# Patient Record
Sex: Male | Born: 2000 | Race: Black or African American | Hispanic: No | Marital: Single | Smoking: Current some day smoker
Health system: Southern US, Community
[De-identification: ages and names within clinical notes are randomized; demographics above are authoritative.]

---
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
## 2021-02-27 ENCOUNTER — Ambulatory Visit: Payer: Self-pay

## 2021-02-27 ENCOUNTER — Other Ambulatory Visit (HOSPITAL_COMMUNITY): Payer: Self-pay

## 2021-02-27 ENCOUNTER — Encounter: Payer: Self-pay | Admitting: Infectious Disease

## 2021-02-27 ENCOUNTER — Other Ambulatory Visit: Payer: Self-pay

## 2021-02-27 ENCOUNTER — Telehealth: Payer: Self-pay

## 2021-02-27 ENCOUNTER — Ambulatory Visit (INDEPENDENT_AMBULATORY_CARE_PROVIDER_SITE_OTHER): Payer: Self-pay | Admitting: Infectious Disease

## 2021-02-27 VITALS — BP 127/82 | HR 77 | Temp 98.0°F | Ht 70.0 in | Wt 158.0 lb

## 2021-02-27 DIAGNOSIS — Z23 Encounter for immunization: Secondary | ICD-10-CM

## 2021-02-27 DIAGNOSIS — B20 Human immunodeficiency virus [HIV] disease: Secondary | ICD-10-CM

## 2021-02-27 MED ORDER — BIKTARVY 50-200-25 MG PO TABS: 1.0000 | ORAL_TABLET | Freq: Every day | ORAL | 11 refills | Status: DC

## 2021-02-27 NOTE — Progress Notes (Signed)
Chief complaint establish care for HIV disease  Subjective:    Patient ID: Ethan Mitchell, male    DOB: 08-02-2001, 20 y.o.   MRN: [id number]  HPI  Ethan Mitchell is a 20 year old black man recently diagnosed with HIV.  He had been tested at the [facility] regularly since 2019.  He had been counseled regarding PrEP but has never initiated it.  He tested positive for HIV on 17 March.  He is referred to clinic for same-day initiation of antiretroviral therapy.  He does not recall any febrile episodes or symptoms or symptoms that were flulike or rash over the last couple of months to suggest symptomatic acute HIV infection.  Clearly based on his testing record though his infection is relatively recent.  We went over all of the different first-line regimens for HIV treatment and he wished to start [facility] and we gave him his first dose today in clinic.  No past medical history on file.     Social History   Socioeconomic History  . Marital status: Single    Spouse name: Not on file  . Number of children: Not on file  . Years of education: Not on file  . Highest education level: Not on file  Occupational History  . Not on file  Tobacco Use  . Smoking status: Current Some Day Smoker    Types: Cigarettes  . Smokeless tobacco: Never Used  . Tobacco comment: social smoker, sometimes vapes   Substance and Sexual Activity  . Alcohol use: Yes    Comment: socially   . Drug use: Yes    Types: Marijuana    Comment: daily   . Sexual activity: Not Currently    Partners: Male  Other Topics Concern  . Not on file  Social History Narrative  . Not on file   Social Determinants of Health   Financial Resource Strain: Not on file  Food Insecurity: Not on file  Transportation Needs: Not on file  Physical Activity: Not on file  Stress: Not on file  Social Connections: Not on file    No Known Allergies   Current Outpatient Medications:  .   bictegravir-emtricitabine-tenofovir AF (BIKTARVY) 50-200-25 MG TABS tablet, Take 1 tablet by mouth daily., Disp: 30 tablet, Rfl: 11    Review of Systems  Constitutional: Negative for activity change, appetite change, chills, diaphoresis, fatigue, fever and unexpected weight change.  HENT: Negative for congestion, rhinorrhea, sinus pressure, sneezing, sore throat and trouble swallowing.   Eyes: Negative for photophobia and visual disturbance.  Respiratory: Negative for cough, chest tightness, shortness of breath, wheezing and stridor.   Cardiovascular: Negative for chest pain, palpitations and leg swelling.  Gastrointestinal: Negative for abdominal distention, abdominal pain, anal bleeding, blood in stool, constipation, diarrhea, nausea and vomiting.  Genitourinary: Negative for difficulty urinating, dysuria, flank pain and hematuria.  Musculoskeletal: Negative for arthralgias, back pain, gait problem, joint swelling and myalgias.  Skin: Negative for color change, pallor, rash and wound.  Neurological: Negative for dizziness, tremors, weakness and light-headedness.  Hematological: Negative for adenopathy. Does not bruise/bleed easily.  Psychiatric/Behavioral: Negative for agitation, behavioral problems, confusion, decreased concentration, dysphoric mood and sleep disturbance.       Objective:   Physical Exam Constitutional:      Appearance: He is well-developed.  HENT:     Head: Normocephalic and atraumatic.  Eyes:     Conjunctiva/sclera: Conjunctivae normal.  Cardiovascular:     Rate and Rhythm: Normal rate and regular rhythm.  Pulmonary:  Effort: Pulmonary effort is normal. No respiratory distress.     Breath sounds: No wheezing.  Abdominal:     General: There is no distension.     Palpations: Abdomen is soft.  Musculoskeletal:        General: No tenderness. Normal range of motion.     Cervical back: Normal range of motion and neck supple.  Skin:    General: Skin is warm  and dry.     Coloration: Skin is not pale.     Findings: No erythema or rash.  Neurological:     General: No focal deficit present.     Mental Status: He is alert and oriented to person, place, and time.  Psychiatric:        Mood and Affect: Mood normal.        Behavior: Behavior normal.        Thought Content: Thought content normal.        Judgment: Judgment normal.           Assessment & Plan:  HIV disease: Relatively recently diagnosed with lymphatic past couple of months likely seroconverted test positive from the March 17 expect his viral load is probably quite high still.  We have started BIKTARVY and he took his first dose today.  He met with Timmothy Sours to enroll into HMAP and RW.  Baseline labs today and he will get repeat labs 1 month from now and see me in 6 weeks.  We will to pull his vaccine records and he seems up-to-date on most vaccines though he does need vaccination for pneumonia and we gave him Prevnar No. 13  I spent greater than 60 minutes with the patient including greater than 50% of time in face to face counsel of the patient the nature of HIV infection how we control it with typically 1 pill once a day antiretroviral therapy how we can protect his immune system from being attacked from HIV with antiretrovirals and preserve his immune function how we can render his virus undetectable in the blood and also then on transmissible to other sexual partners and in coordination of his care

## 2021-02-27 NOTE — Telephone Encounter (Addendum)
[facility] Patient Product/process development scientist completed.    The patient is uninsured and will need patient assistance for medication.  We gave patient 4 bottles (28 day supply) of Biktarvy Samples  We can complete the application and will need to meet with the patient for signatures and income documentation.  Clearance Coots, CPhT Specialty Pharmacy Patient [facility] for Infectious Disease Phone: [phone] Fax:  [phone]

## 2021-02-28 LAB — URINE CYTOLOGY ANCILLARY ONLY
Chlamydia: NEGATIVE
Comment: NEGATIVE
Comment: NORMAL
Neisseria Gonorrhea: NEGATIVE

## 2021-02-28 LAB — T-HELPER CELL (CD4) - (RCID CLINIC ONLY)
CD4 % Helper T Cell: 40 % (ref 33–65)
CD4 T Cell Abs: 769 /uL (ref 400–1790)

## 2021-03-07 ENCOUNTER — Telehealth: Payer: Self-pay

## 2021-03-07 NOTE — Telephone Encounter (Addendum)
[facility] Patient Advocate Encounter  Completed and sent [employer] Advancing Access application for Biktarvy for this patient who is uninsured.    Patient is approved 03/07/21 through 03/07/22.        Clearance Coots, CPhT Specialty Pharmacy Patient [facility] for Infectious Disease Phone: [phone] Fax:  [phone]

## 2021-03-09 LAB — COMPLETE METABOLIC PANEL WITH GFR
AG Ratio: 1.5 (calc) (ref 1.0–2.5)
ALT: 13 U/L (ref 9–46)
AST: 24 U/L (ref 10–40)
Albumin: 4.8 g/dL (ref 3.6–5.1)
Alkaline phosphatase (APISO): 54 U/L (ref 36–130)
BUN: 13 mg/dL (ref 7–25)
CO2: 26 mmol/L (ref 20–32)
Calcium: 9.5 mg/dL (ref 8.6–10.3)
Chloride: 101 mmol/L (ref 98–110)
Creat: 1.01 mg/dL (ref 0.60–1.35)
GFR, Est African American: 123 mL/min/{1.73_m2} (ref >=60)
GFR, Est Non African American: 106 mL/min/{1.73_m2} (ref >=60)
Globulin: 3.1 g/dL (calc) (ref 1.9–3.7)
Glucose, Bld: 85 mg/dL (ref 65–99)
Potassium: 3.9 mmol/L (ref 3.5–5.3)
Sodium: 135 mmol/L (ref 135–146)
Total Bilirubin: 0.4 mg/dL (ref 0.2–1.2)
Total Protein: 7.9 g/dL (ref 6.1–8.1)

## 2021-03-09 LAB — HIV-1 GENOTYPE: HIV-1 Genotype: DETECTED — AB

## 2021-03-09 LAB — FLUORESCENT TREPONEMAL AB(FTA)-IGG-BLD: Fluorescent Treponemal ABS: REACTIVE — AB

## 2021-03-09 LAB — CBC WITH DIFFERENTIAL/PLATELET
Absolute Monocytes: 485 cells/uL (ref 200–950)
Basophils Absolute: 30 cells/uL (ref 0–200)
Basophils Relative: 0.6 %
Eosinophils Absolute: 180 cells/uL (ref 15–500)
Eosinophils Relative: 3.6 %
HCT: 38.4 % — ABNORMAL LOW (ref 38.5–50.0)
Hemoglobin: 12.2 g/dL — ABNORMAL LOW (ref 13.2–17.1)
Lymphs Abs: 2230 cells/uL (ref 850–3900)
MCH: 25.3 pg — ABNORMAL LOW (ref 27.0–33.0)
MCHC: 31.8 g/dL — ABNORMAL LOW (ref 32.0–36.0)
MCV: 79.7 fL — ABNORMAL LOW (ref 80.0–100.0)
MPV: 9.1 fL (ref 7.5–12.5)
Monocytes Relative: 9.7 %
Neutro Abs: 2075 cells/uL (ref 1500–7800)
Neutrophils Relative %: 41.5 %
Platelets: 317 10*3/uL (ref 140–400)
RBC: 4.82 10*6/uL (ref 4.20–5.80)
RDW: 14.1 % (ref 11.0–15.0)
Total Lymphocyte: 44.6 %
WBC: 5 10*3/uL (ref 3.8–10.8)

## 2021-03-09 LAB — QUANTIFERON-TB GOLD PLUS
Mitogen-NIL: >10 IU/mL
NIL: 0.15 IU/mL
QuantiFERON-TB Gold Plus: NEGATIVE
TB1-NIL: 0 IU/mL
TB2-NIL: <0 IU/mL

## 2021-03-09 LAB — HIV-1 INTEGRASE GENOTYPE

## 2021-03-09 LAB — RPR TITER: RPR Titer: 1:4 {titer} — ABNORMAL HIGH

## 2021-03-09 LAB — HEPATITIS PANEL, ACUTE
Hep A IgM: NONREACTIVE
Hep B C IgM: NONREACTIVE
Hepatitis B Surface Ag: NONREACTIVE
Hepatitis C Ab: NONREACTIVE
SIGNAL TO CUT-OFF: 0.27 (ref <1.00)

## 2021-03-09 LAB — HLA B*5701: HLA-B*5701 w/rflx HLA-B High: NEGATIVE

## 2021-03-09 LAB — RPR: RPR Ser Ql: REACTIVE — AB

## 2021-03-09 LAB — HIV RNA, RTPCR W/R GT (RTI, PI,INT)
HIV 1 RNA Quant: 22600 copies/mL — ABNORMAL HIGH
HIV-1 RNA Quant, Log: 4.35 Log copies/mL — ABNORMAL HIGH

## 2021-04-11 ENCOUNTER — Ambulatory Visit (INDEPENDENT_AMBULATORY_CARE_PROVIDER_SITE_OTHER): Payer: Self-pay | Admitting: Infectious Disease

## 2021-04-11 ENCOUNTER — Encounter: Payer: Self-pay | Admitting: Infectious Disease

## 2021-04-11 ENCOUNTER — Other Ambulatory Visit: Payer: Self-pay

## 2021-04-11 VITALS — BP 136/81 | HR 82 | Temp 98.4°F | Wt 161.4 lb

## 2021-04-11 DIAGNOSIS — R197 Diarrhea, unspecified: Secondary | ICD-10-CM

## 2021-04-11 DIAGNOSIS — F32A Depression, unspecified: Secondary | ICD-10-CM

## 2021-04-11 DIAGNOSIS — F329 Major depressive disorder, single episode, unspecified: Secondary | ICD-10-CM

## 2021-04-11 DIAGNOSIS — B20 Human immunodeficiency virus [HIV] disease: Secondary | ICD-10-CM | POA: Insufficient documentation

## 2021-04-11 HISTORY — DX: Human immunodeficiency virus (HIV) disease: B20

## 2021-04-11 NOTE — Progress Notes (Signed)
Chief complaint: He had some loose stools after starting BIKTARVY also had some trouble for short bit of time with some depressive symptoms as he came to terms with his HIV diagnosis.  Subjective:    Patient ID: Ethan Mitchell, male    DOB: Jun 24, 2001, 20 y.o.   MRN: [id number]  HPI  Ethan Mitchell is a 20 year old black man recently diagnosed with HIV.  He had been tested at the [facility] department regularly since 2019.  He had been counseled regarding PrEP but has never initiated it.  He tested positive for HIV on 17 March, 2022.  He was referred to our clinic for same-day initiation of antiretroviral therapy.  He did not recall any febrile episodes or symptoms or symptoms that were flulike or rash over the last couple of months to suggest symptomatic acute HIV infection.  Clearly based on his testing record though his infection is relatively recent.  We went over all of the different first-line regimens for HIV treatment and he wished to start [facility] and we gave him his first dose clinic as last visit.  He says he has been highly here and to his medications.  We wanted to check repeat labs today and expect his viral load should be undetectable by now.    Past Medical History:  Diagnosis Date  . HIV disease (HCC) 04/11/2021       Social History   Socioeconomic History  . Marital status: Single    Spouse name: Not on file  . Number of children: Not on file  . Years of education: Not on file  . Highest education level: Not on file  Occupational History  . Not on file  Tobacco Use  . Smoking status: Current Some Day Smoker    Types: E-cigarettes  . Smokeless tobacco: Never Used  . Tobacco comment: social smoker, sometimes vapes   Substance and Sexual Activity  . Alcohol use: Yes    Comment: socially   . Drug use: Yes    Types: Marijuana    Comment: daily   . Sexual activity: Not Currently    Partners: Male  Other Topics Concern  . Not on file  Social  History Narrative  . Not on file   Social Determinants of Health   Financial Resource Strain: Not on file  Food Insecurity: Not on file  Transportation Needs: Not on file  Physical Activity: Not on file  Stress: Not on file  Social Connections: Not on file    No Known Allergies   Current Outpatient Medications:  .  bictegravir-emtricitabine-tenofovir AF (BIKTARVY) 50-200-25 MG TABS tablet, Take 1 tablet by mouth daily., Disp: 30 tablet, Rfl: 11    Review of Systems  Constitutional: Negative for activity change, appetite change, chills, diaphoresis, fatigue, fever and unexpected weight change.  HENT: Negative for congestion, rhinorrhea, sinus pressure, sneezing, sore throat and trouble swallowing.   Eyes: Negative for photophobia and visual disturbance.  Respiratory: Negative for cough, chest tightness, shortness of breath, wheezing and stridor.   Cardiovascular: Negative for chest pain, palpitations and leg swelling.  Gastrointestinal: Positive for diarrhea. Negative for abdominal distention, abdominal pain, anal bleeding, blood in stool, constipation, nausea and vomiting.  Genitourinary: Negative for difficulty urinating, dysuria, flank pain and hematuria.  Musculoskeletal: Negative for arthralgias, back pain, gait problem, joint swelling and myalgias.  Skin: Negative for color change, pallor, rash and wound.  Neurological: Negative for dizziness, tremors, weakness and light-headedness.  Hematological: Negative for adenopathy. Does not bruise/bleed easily.  Psychiatric/Behavioral: Positive for dysphoric mood. Negative for agitation, behavioral problems, confusion, decreased concentration and sleep disturbance.       Objective:   Physical Exam Constitutional:      Appearance: He is well-developed.  HENT:     Head: Normocephalic and atraumatic.  Eyes:     Extraocular Movements: Extraocular movements intact.     Conjunctiva/sclera: Conjunctivae normal.  Cardiovascular:      Rate and Rhythm: Normal rate and regular rhythm.  Pulmonary:     Effort: Pulmonary effort is normal. No respiratory distress.     Breath sounds: No wheezing.  Abdominal:     General: There is no distension.     Palpations: Abdomen is soft.  Musculoskeletal:        General: No tenderness. Normal range of motion.     Cervical back: Normal range of motion and neck supple.  Skin:    General: Skin is warm and dry.     Coloration: Skin is not pale.     Findings: No erythema or rash.  Neurological:     General: No focal deficit present.     Mental Status: He is alert and oriented to person, place, and time.  Psychiatric:        Mood and Affect: Mood normal.        Behavior: Behavior normal.        Thought Content: Thought content normal.        Judgment: Judgment normal.           Assessment & Plan:  HIV disease: Continue Biktarvy checking labs today we will need to renew [facility] PHP program in July.  Loose stools: Could be related to initiating BIKTARVY seems to be resolving.  Some depressive symptoms around his diagnosis and stigma involved but he says that he is come to terms with him is moving on.  Vaccines: does he need pneumovax 23, I would think so still.  I spent greater than 40 minutes with the patient including greater than 50% of time in face to face counsel of the patient re the nature of HIV, his labs which we reviewed and in coordination of his care.

## 2021-04-12 LAB — T-HELPER CELL (CD4) - (RCID CLINIC ONLY)
CD4 % Helper T Cell: 39 % (ref 33–65)
CD4 T Cell Abs: 776 /uL (ref 400–1790)

## 2021-04-13 LAB — COMPLETE METABOLIC PANEL WITH GFR
AG Ratio: 1.5 (calc) (ref 1.0–2.5)
ALT: 10 U/L (ref 9–46)
AST: 19 U/L (ref 10–40)
Albumin: 4.4 g/dL (ref 3.6–5.1)
Alkaline phosphatase (APISO): 49 U/L (ref 36–130)
BUN: 10 mg/dL (ref 7–25)
CO2: 27 mmol/L (ref 20–32)
Calcium: 8.9 mg/dL (ref 8.6–10.3)
Chloride: 105 mmol/L (ref 98–110)
Creat: 1.02 mg/dL (ref 0.60–1.35)
GFR, Est African American: 121 mL/min/{1.73_m2} (ref >=60)
GFR, Est Non African American: 105 mL/min/{1.73_m2} (ref >=60)
Globulin: 2.9 g/dL (calc) (ref 1.9–3.7)
Glucose, Bld: 91 mg/dL (ref 65–99)
Potassium: 4.1 mmol/L (ref 3.5–5.3)
Sodium: 137 mmol/L (ref 135–146)
Total Bilirubin: 0.3 mg/dL (ref 0.2–1.2)
Total Protein: 7.3 g/dL (ref 6.1–8.1)

## 2021-04-13 LAB — CBC WITH DIFFERENTIAL/PLATELET
Absolute Monocytes: 510 cells/uL (ref 200–950)
Basophils Absolute: 31 cells/uL (ref 0–200)
Basophils Relative: 0.6 %
Eosinophils Absolute: 218 cells/uL (ref 15–500)
Eosinophils Relative: 4.2 %
HCT: 38.4 % — ABNORMAL LOW (ref 38.5–50.0)
Hemoglobin: 12.2 g/dL — ABNORMAL LOW (ref 13.2–17.1)
Lymphs Abs: 2257 cells/uL (ref 850–3900)
MCH: 25.1 pg — ABNORMAL LOW (ref 27.0–33.0)
MCHC: 31.8 g/dL — ABNORMAL LOW (ref 32.0–36.0)
MCV: 79 fL — ABNORMAL LOW (ref 80.0–100.0)
MPV: 9.1 fL (ref 7.5–12.5)
Monocytes Relative: 9.8 %
Neutro Abs: 2184 cells/uL (ref 1500–7800)
Neutrophils Relative %: 42 %
Platelets: 311 10*3/uL (ref 140–400)
RBC: 4.86 10*6/uL (ref 4.20–5.80)
RDW: 14 % (ref 11.0–15.0)
Total Lymphocyte: 43.4 %
WBC: 5.2 10*3/uL (ref 3.8–10.8)

## 2021-04-13 LAB — HIV-1 RNA QUANT-NO REFLEX-BLD
HIV 1 RNA Quant: <20 Copies/mL — ABNORMAL HIGH
HIV-1 RNA Quant, Log: <1.3 Log cps/mL — ABNORMAL HIGH

## 2021-05-03 ENCOUNTER — Encounter: Payer: Self-pay | Admitting: Infectious Disease

## 2021-05-30 ENCOUNTER — Telehealth: Payer: Self-pay | Admitting: *Deleted

## 2021-05-30 NOTE — Telephone Encounter (Signed)
Patient called, stating he is having difficulty refilling Biktarvy at [facility]. He took his last pill 05/26/21. Prescription was sent 02/27/21 with 11 refills. Paitent is over income for ADAP, has [employer] coverage for 1 year. He has the [employer] card with him. RN spoke with [facility]. They were unsure of the issue, see the correct number of refills and copay information. They will fill this today.  Andree Coss, RN

## 2021-06-05 ENCOUNTER — Encounter: Payer: Self-pay | Admitting: Infectious Disease

## 2021-06-05 ENCOUNTER — Other Ambulatory Visit: Payer: Self-pay

## 2021-06-05 ENCOUNTER — Ambulatory Visit (INDEPENDENT_AMBULATORY_CARE_PROVIDER_SITE_OTHER): Payer: Self-pay | Admitting: Infectious Disease

## 2021-06-05 VITALS — BP 144/90 | HR 69 | Temp 98.0°F | Wt 155.0 lb

## 2021-06-05 DIAGNOSIS — B20 Human immunodeficiency virus [HIV] disease: Secondary | ICD-10-CM

## 2021-06-05 NOTE — Progress Notes (Signed)
Chief complaint: Concern about the fact that he missed 2 doses of BIKTARVY Subjective:    Patient ID: Ethan Mitchell, male    DOB: [birth year], 20 y.o.   MRN: [id number]  HPI  Ethan Mitchell is a 20 year old black man recently diagnosed with HIV, placed on Biktarvy with perfect virological suppression.  He was over income for [employer]/H MAP but will have insurance kick in through work in August.  Therefore I did not think would be prudent to get labs today we do not have coverage for them in their entirety and when he is highly likely to still be suppressed.  He is tolerating Biktarvy without problems.      Past Medical History:  Diagnosis Date   HIV disease (HCC) 04/11/2021       Social History   Socioeconomic History   Marital status: Single    Spouse name: Not on file   Number of children: Not on file   Years of education: Not on file   Highest education level: Not on file  Occupational History   Not on file  Tobacco Use   Smoking status: Some Days    Pack years: 0.00    Types: E-cigarettes   Smokeless tobacco: Never   Tobacco comments:    social smoker, sometimes vapes   Substance and Sexual Activity   Alcohol use: Yes    Comment: socially    Drug use: Yes    Types: Marijuana    Comment: daily    Sexual activity: Not Currently    Partners: Male  Other Topics Concern   Not on file  Social History Narrative   Not on file   Social Determinants of Health   Financial Resource Strain: Not on file  Food Insecurity: Not on file  Transportation Needs: Not on file  Physical Activity: Not on file  Stress: Not on file  Social Connections: Not on file    No Known Allergies   Current Outpatient Medications:    bictegravir-emtricitabine-tenofovir AF (BIKTARVY) 50-200-25 MG TABS tablet, Take 1 tablet by mouth daily., Disp: 30 tablet, Rfl: 11    Review of Systems  Constitutional:  Negative for activity change, appetite change, chills, diaphoresis, fatigue, fever  and unexpected weight change.  HENT:  Negative for congestion, rhinorrhea, sinus pressure, sneezing, sore throat and trouble swallowing.   Eyes:  Negative for photophobia and visual disturbance.  Respiratory:  Negative for cough, chest tightness, shortness of breath, wheezing and stridor.   Cardiovascular:  Negative for chest pain, palpitations and leg swelling.  Gastrointestinal:  Negative for abdominal distention, abdominal pain, anal bleeding, blood in stool, constipation, diarrhea, nausea and vomiting.  Genitourinary:  Negative for difficulty urinating, dysuria, flank pain and hematuria.  Musculoskeletal:  Negative for arthralgias, back pain, gait problem, joint swelling and myalgias.  Skin:  Negative for color change, pallor, rash and wound.  Neurological:  Negative for dizziness, tremors, weakness and light-headedness.  Hematological:  Negative for adenopathy. Does not bruise/bleed easily.  Psychiatric/Behavioral:  Negative for agitation, behavioral problems, confusion, decreased concentration, dysphoric mood and sleep disturbance.       Objective:   Physical Exam HENT:     Head: Normocephalic and atraumatic.  Eyes:     Extraocular Movements: Extraocular movements intact.     Conjunctiva/sclera: Conjunctivae normal.  Cardiovascular:     Rate and Rhythm: Normal rate and regular rhythm.  Pulmonary:     Effort: Pulmonary effort is normal. No respiratory distress.     Breath sounds: No  wheezing.  Abdominal:     General: There is no distension.     Palpations: Abdomen is soft.  Musculoskeletal:        General: No tenderness. Normal range of motion.     Cervical back: Normal range of motion and neck supple.  Skin:    General: Skin is warm and dry.     Coloration: Skin is not pale.     Findings: No erythema or rash.  Neurological:     General: No focal deficit present.     Mental Status: He is alert and oriented to person, place, and time.  Psychiatric:        Mood and Affect:  Mood normal.        Behavior: Behavior normal.        Thought Content: Thought content normal.        Judgment: Judgment normal.          Assessment & Plan:  HIV disease: Continue Biktarvy which is being covered by [employer]'s advancing access program with coverage through April 2023.  He should have insurance in August and come see me in September when get labs performed at that time and also vaccinate him provide other care he needs

## 2021-08-01 ENCOUNTER — Ambulatory Visit: Payer: Self-pay | Admitting: Infectious Disease

## 2021-08-28 ENCOUNTER — Other Ambulatory Visit: Payer: Self-pay

## 2021-08-28 ENCOUNTER — Emergency Department (HOSPITAL_COMMUNITY)
Admission: EM | Admit: 2021-08-28 | Discharge: 2021-08-28 | Disposition: A | Discharge status: Home / Self Care | Payer: Self-pay | Attending: Emergency Medicine | Admitting: Emergency Medicine

## 2021-08-28 ENCOUNTER — Emergency Department (HOSPITAL_COMMUNITY): Payer: Self-pay

## 2021-08-28 ENCOUNTER — Encounter (HOSPITAL_COMMUNITY): Payer: Self-pay | Admitting: Emergency Medicine

## 2021-08-28 DIAGNOSIS — T148XXA Other injury of unspecified body region, initial encounter: Secondary | ICD-10-CM

## 2021-08-28 DIAGNOSIS — Z21 Asymptomatic human immunodeficiency virus [HIV] infection status: Secondary | ICD-10-CM | POA: Insufficient documentation

## 2021-08-28 DIAGNOSIS — F17290 Nicotine dependence, other tobacco product, uncomplicated: Secondary | ICD-10-CM | POA: Insufficient documentation

## 2021-08-28 DIAGNOSIS — W25XXXA Contact with sharp glass, initial encounter: Secondary | ICD-10-CM | POA: Insufficient documentation

## 2021-08-28 DIAGNOSIS — F10920 Alcohol use, unspecified with intoxication, uncomplicated: Secondary | ICD-10-CM

## 2021-08-28 DIAGNOSIS — S0081XA Abrasion of other part of head, initial encounter: Secondary | ICD-10-CM | POA: Insufficient documentation

## 2021-08-28 DIAGNOSIS — Z23 Encounter for immunization: Secondary | ICD-10-CM | POA: Insufficient documentation

## 2021-08-28 DIAGNOSIS — F10120 Alcohol abuse with intoxication, uncomplicated: Secondary | ICD-10-CM | POA: Insufficient documentation

## 2021-08-28 DIAGNOSIS — Y908 Blood alcohol level of 240 mg/100 ml or more: Secondary | ICD-10-CM | POA: Insufficient documentation

## 2021-08-28 LAB — CBC
HCT: 40.6 % (ref 39.0–52.0)
Hemoglobin: 12.6 g/dL — ABNORMAL LOW (ref 13.0–17.0)
MCH: 25.3 pg — ABNORMAL LOW (ref 26.0–34.0)
MCHC: 31 g/dL (ref 30.0–36.0)
MCV: 81.5 fL (ref 80.0–100.0)
Platelets: 350 10*3/uL (ref 150–400)
RBC: 4.98 MIL/uL (ref 4.22–5.81)
RDW: 14 % (ref 11.5–15.5)
WBC: 7 10*3/uL (ref 4.0–10.5)
nRBC: 0 % (ref 0.0–0.2)

## 2021-08-28 LAB — BASIC METABOLIC PANEL
Anion gap: 9 (ref 5–15)
BUN: 12 mg/dL (ref 6–20)
CO2: 23 mmol/L (ref 22–32)
Calcium: 8.6 mg/dL — ABNORMAL LOW (ref 8.9–10.3)
Chloride: 105 mmol/L (ref 98–111)
Creatinine, Ser: 1.24 mg/dL (ref 0.61–1.24)
GFR, Estimated: >60 mL/min (ref >60)
Glucose, Bld: 116 mg/dL — ABNORMAL HIGH (ref 70–99)
Potassium: 3.6 mmol/L (ref 3.5–5.1)
Sodium: 137 mmol/L (ref 135–145)

## 2021-08-28 LAB — PROTIME-INR
INR: 0.9 (ref 0.8–1.2)
Prothrombin Time: 11.9 seconds (ref 11.4–15.2)

## 2021-08-28 LAB — ETHANOL: Alcohol, Ethyl (B): 251 mg/dL — ABNORMAL HIGH (ref <10)

## 2021-08-28 MED ORDER — DIPHENHYDRAMINE HCL 50 MG/ML IJ SOLN: 12.5000 mg | Freq: Once | INTRAMUSCULAR | Status: DC

## 2021-08-28 MED ORDER — DIPHENHYDRAMINE HCL 50 MG/ML IJ SOLN
INTRAMUSCULAR | Status: AC
  Filled 2021-08-28: qty 1

## 2021-08-28 MED ORDER — TETANUS-DIPHTH-ACELL PERTUSSIS 5-2.5-18.5 LF-MCG/0.5 IM SUSY
0.5000 mL | PREFILLED_SYRINGE | Freq: Once | INTRAMUSCULAR | Status: AC
  Administered 2021-08-28: 0.5 mL via INTRAMUSCULAR
  Filled 2021-08-28: qty 0.5

## 2021-08-28 NOTE — ED Notes (Signed)
Reviewed discharge instructions with patient and father. Follow-up care reviewed. Patient and father verbalized understanding. Patient A&Ox4, VSS, and ambulatory with steady gait upon discharge.

## 2021-08-28 NOTE — ED Notes (Signed)
Pt's father at bedside. Pt currently agreeing to go to CT

## 2021-08-28 NOTE — Discharge Instructions (Addendum)
You were evaluated in the Emergency Department and after careful evaluation, we did not find any emergent condition requiring admission or further testing in the hospital.  Your CT scans today were overall reassuring.  Your tetanus was updated today.  Please keep the wound clean and dry.  Monitor for signs of infection including worsening redness, swelling, fevers, chills and seek medical care if you experience these.  You may experience concussion-like symptoms including sensitivity to light, headache, nausea.  Please make sure to return to the ER if you have worsening vomiting, sleepiness, confusion, or any other new or concerning symptoms.  We encourage you to follow up with a primary care provider.  Thank you for allowing [place] to be a part of your care.
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
## 2021-08-28 NOTE — ED Provider Notes (Signed)
[facility] EMERGENCY DEPARTMENT Provider Note   CSN: [id number] Arrival date & time: 08/28/21  2778     History Chief Complaint  Patient presents with   Head Injury    Ethan Mitchell is a 20 y.o. male.  HPI 20 year old male with HIV presents to the ER with a head trauma to the forehead.  Level 5 caveat as history is unclear, patient just replies to "I do not know what happened".  Patient reportedly called his parent was found sitting on the sidewalk on the side of road with blood all over his face, evidence of head injury.  There were shards of glass scattered in the patient's hair and in the wounds.  His Tdap status is unknown.  There is EtOH on board, patient admits to vodka consumption but cannot tell me how much.  He arrived in a c-collar placed by EMS.    Past Medical History:  Diagnosis Date   HIV disease (HCC) 04/11/2021    Patient Active Problem List   Diagnosis Date Noted   HIV disease (HCC) 04/11/2021    History reviewed. No pertinent surgical history.     No family history on file.  Social History   Tobacco Use   Smoking status: Some Days    Types: E-cigarettes   Smokeless tobacco: Never   Tobacco comments:    social smoker, sometimes vapes   Substance Use Topics   Alcohol use: Yes    Comment: socially    Drug use: Yes    Types: Marijuana    Comment: daily     Home Medications Prior to Admission medications   Medication Sig Start Date End Date Taking? Authorizing Provider  bictegravir-emtricitabine-tenofovir AF (BIKTARVY) 50-200-25 MG TABS tablet Take 1 tablet by mouth daily. 02/27/21  Yes Daiva Eves, Lisette Grinder, MD    Allergies    Patient has no known allergies.  Review of Systems   Review of Systems Ten systems reviewed and are negative for acute change, except as noted in the HPI.   Physical Exam Updated Vital Signs BP (!) 159/104   Pulse (!) 119   Temp 97.7 F (36.5 C)   Resp 16   SpO2 100%   Physical Exam Vitals  and nursing note reviewed.  Constitutional:      Appearance: He is well-developed.     Comments: Patient is alert, slurring words, intoxicated appearing  HENT:     Head: Normocephalic and atraumatic.  Eyes:     Conjunctiva/sclera: Conjunctivae normal.  Cardiovascular:     Rate and Rhythm: Normal rate and regular rhythm.     Heart sounds: No murmur heard. Pulmonary:     Effort: Pulmonary effort is normal. No respiratory distress.     Breath sounds: Normal breath sounds.  Abdominal:     Palpations: Abdomen is soft.     Tenderness: There is no abdominal tenderness.  Musculoskeletal:     Cervical back: Neck supple.     Comments: Moving all 4 extremities without difficulty, pulling on c-collar stating he wanted off  Skin:    General: Skin is warm and dry.     Comments: Visible superficial abrasion to the forehead with small shards of glass.  Bleeding controlled.  No evidence of raccoon eyes, hemotympanum, battle sign.  Neurological:     General: No focal deficit present.     Mental Status: He is alert.     Sensory: No sensory deficit.     Motor: No weakness.  ED Results / Procedures / Treatments   Labs (all labs ordered are listed, but only abnormal results are displayed) Labs Reviewed  CBC - Abnormal; Notable for the following components:      Result Value   Hemoglobin 12.6 (*)    MCH 25.3 (*)    All other components within normal limits  BASIC METABOLIC PANEL - Abnormal; Notable for the following components:   Glucose, Bld 116 (*)    Calcium 8.6 (*)    All other components within normal limits  ETHANOL - Abnormal; Notable for the following components:   Alcohol, Ethyl (B) 251 (*)    All other components within normal limits  PROTIME-INR  RAPID URINE DRUG SCREEN, HOSP PERFORMED    EKG None  Radiology CT HEAD WO CONTRAST ( )  Result Date: 08/28/2021 CLINICAL DATA:  Alcohol and head trauma. EXAM: CT HEAD WITHOUT CONTRAST CT CERVICAL SPINE WITHOUT CONTRAST  TECHNIQUE: Multidetector CT imaging of the head and cervical spine was performed following the standard protocol without intravenous contrast. Multiplanar CT image reconstructions of the cervical spine were also generated. COMPARISON:  None. FINDINGS: CT HEAD FINDINGS Brain: No evidence of acute infarction, hemorrhage, hydrocephalus, extra-axial collection or mass lesion/mass effect. Vascular: No hyperdense vessel or unexpected calcification. Skull: Normal. Negative for fracture or focal lesion. Sinuses/Orbits: No acute finding. CT CERVICAL SPINE FINDINGS Alignment: Normal. Skull base and vertebrae: No acute fracture. No primary bone lesion or focal pathologic process. Soft tissues and spinal canal: No prevertebral fluid or swelling. No visible canal hematoma. Disc levels:  No degenerative changes Upper chest: Negative IMPRESSION: No evidence of intracranial or cervical spine injury Electronically Signed   By: Tiburcio Pea M.D.   On: 08/28/2021 06:50   CT Cervical Spine Wo Contrast  Result Date: 08/28/2021 CLINICAL DATA:  Alcohol and head trauma. EXAM: CT HEAD WITHOUT CONTRAST CT CERVICAL SPINE WITHOUT CONTRAST TECHNIQUE: Multidetector CT imaging of the head and cervical spine was performed following the standard protocol without intravenous contrast. Multiplanar CT image reconstructions of the cervical spine were also generated. COMPARISON:  None. FINDINGS: CT HEAD FINDINGS Brain: No evidence of acute infarction, hemorrhage, hydrocephalus, extra-axial collection or mass lesion/mass effect. Vascular: No hyperdense vessel or unexpected calcification. Skull: Normal. Negative for fracture or focal lesion. Sinuses/Orbits: No acute finding. CT CERVICAL SPINE FINDINGS Alignment: Normal. Skull base and vertebrae: No acute fracture. No primary bone lesion or focal pathologic process. Soft tissues and spinal canal: No prevertebral fluid or swelling. No visible canal hematoma. Disc levels:  No degenerative changes  Upper chest: Negative IMPRESSION: No evidence of intracranial or cervical spine injury Electronically Signed   By: Tiburcio Pea M.D.   On: 08/28/2021 06:50    Procedures Procedures   Medications Ordered in ED Medications  Tdap (BOOSTRIX) injection 0.5 mL (has no administration in time range)  diphenhydrAMINE (BENADRYL) injection 12.5 mg (has no administration in time range)    ED Course  I have reviewed the triage vital signs and the nursing notes.  Pertinent labs & imaging results that were available during my care of the patient were reviewed by me and considered in my medical decision making (see chart for details).    MDM Rules/Calculators/A&P                           20 year old male presenting after an unknown trauma with head injury.  EtOH on board.  He was incredibly noncompliant with the treatment plan, pulling on  the c-collar, stating that he was not going to proceed with any CT imaging until his father arrived.  He refused Benadryl administration.  I explained to the patient that he needs to keep his c-collar on to prevent possible paralysis in case he has a cervical spine fracture, the patient voiced understanding but proceeded to continue to pull out his c-collar.  Tetanus updated, wound cleaned.  Father at bedside educated on signs of concussions and return precautions.  Infectious symptoms discussed.  He voiced understanding and is agreeable.  Stable for discharge.  This was a shared visit with my supervising physician Dr. Nicanor Alcon who independently saw and evaluated the patient & provided guidance in evaluation/management/disposition ,in agreement with care  Final Clinical Impression(s) / ED Diagnoses Final diagnoses:  Abrasion  Alcoholic intoxication without complication [facility])    Rx / DC Orders ED Discharge Orders     None        Leone Brand 08/28/21 0881    Palumbo, April, MD 08/28/21 2309

## 2021-08-28 NOTE — ED Triage Notes (Signed)
Pt's has obvious head trauma to the forehead.  He called his parents who found him sitting on the sidewalk. Unknown if pt had LOC, ETOH on board. He is unable to recall events from earlier in the evening.  C-collar placed on pt for safety.

## 2021-08-28 NOTE — ED Provider Notes (Signed)
Emergency Medicine Provider Triage Evaluation Note  Ethan Mitchell , a 20 y.o. male  was evaluated in triage.  Pt altered, apparently amnestic to the events of the evening. Concern for ETOH. Patient reportedly called parents who subsequently found him sitting on the sidewalk with blood over his face, evidence of head injury. Shards of glass scattered in patient's hair and wounds. Tdap unknown.  Level 5 caveat 2/2 AMS  Review of Systems  Positive: AMS, head injury Negative: As above  Physical Exam  There were no vitals taken for this visit. Gen:   Lethargic, confused Resp:  Normal effort  MSK:   Moves extremities without difficulty  Other:  Contusion to forehead with abrasions. Glass noted in wounds. Dried blood down face. PERRL.  Medical Decision Making  Medically screening exam initiated at 5:16 AM.  Appropriate orders placed.  TAJAE RYBICKI was informed that the remainder of the evaluation will be completed by another provider, this initial triage assessment does not replace that evaluation, and the importance of remaining in the ED until their evaluation is complete.  Head injury; AMS   Antony Madura, Cordelia Poche 08/28/21 0518    Tilden Fossa, MD 08/28/21 [phone]

## 2021-08-28 NOTE — ED Notes (Signed)
ED providers at bedside with pt and pt's mother. EDP gave verbal order for 12.5mg  IM benadryl due to pt exhibiting significant anxiety over a transporter attempting to bring him to CT for CT head and C-spine. Pt refused to allow this RN to administer the aforementioned benadryl, repeatedly interrupting staff and stating, "You are not injecting me or taking me to catscan before my dad gets here." Pt's mother told staff to hold off on medications and imaging until pt's father arrives, as pt demanded. Both this RN and ED providers explained the potential consequences of not allowing staff to immediately conduct necessary imaging, including possible c-spine compromise and brain bleeding. Pt acknowledged these statements and again stated "I am not going to catscan and you are not injecting me with anything until my dad gets here."

## 2021-08-28 NOTE — ED Notes (Signed)
Pt exiting his room with his mother, stating that he needs to call his father. Very difficult to orient back to the bed. Would not allow EDP to take a picture of the wound, even after EDP explained that it is for his medical chart.

## 2022-03-05 IMAGING — CT CT CERVICAL SPINE W/O CM
3 of 4 series · 13 of 33 positions shown, 15 images · non-contrast
Comparison: None.

CLINICAL DATA: Alcohol and head trauma.

EXAM:
CT HEAD WITHOUT CONTRAST
CT CERVICAL SPINE WITHOUT CONTRAST
TECHNIQUE: Multidetector CT imaging of the head and cervical spine was
performed following the standard protocol without intravenous
contrast. Multiplanar CT image reconstructions of the cervical spine
were also generated.

[Series 5: c spine soft · axial · 0.43mm/px · z∈[-336,-200]mm · 5 of 104 slices shown]
[im 18/104  soft-tissue]
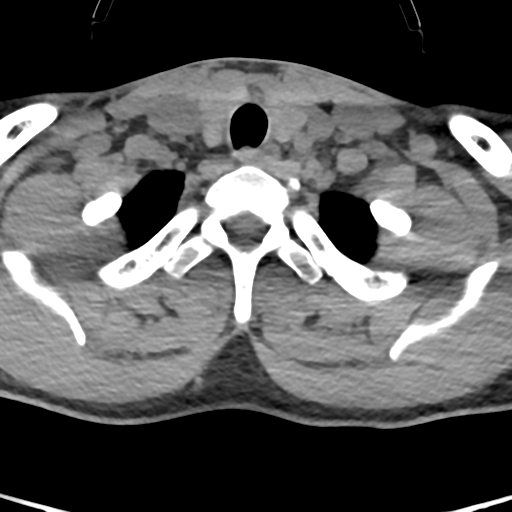
[im 35/104  soft-tissue]
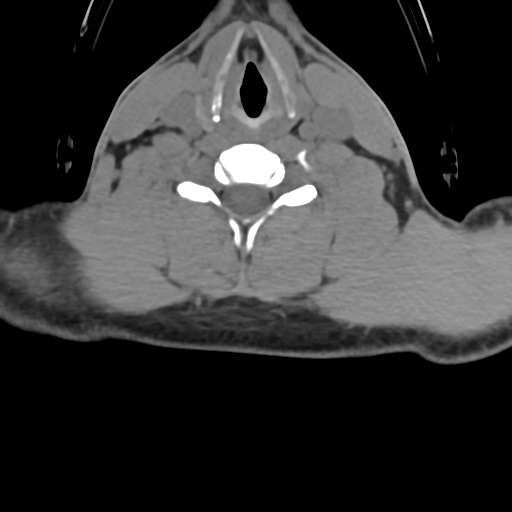
[im 52/104  soft-tissue]
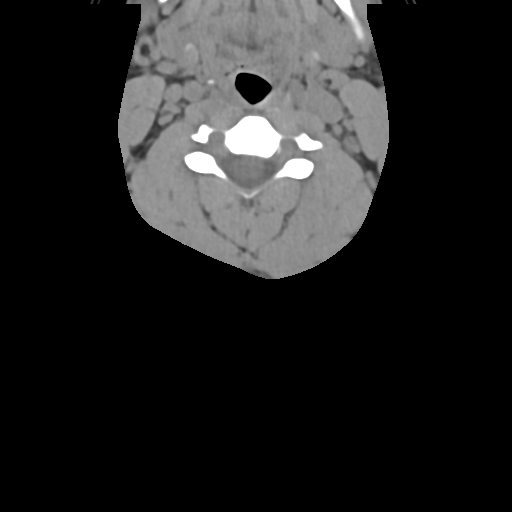
[im 69/104  soft-tissue]
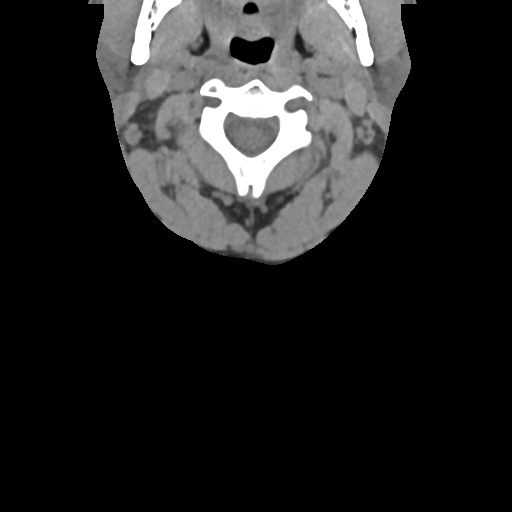
[im 86/104  soft-tissue]
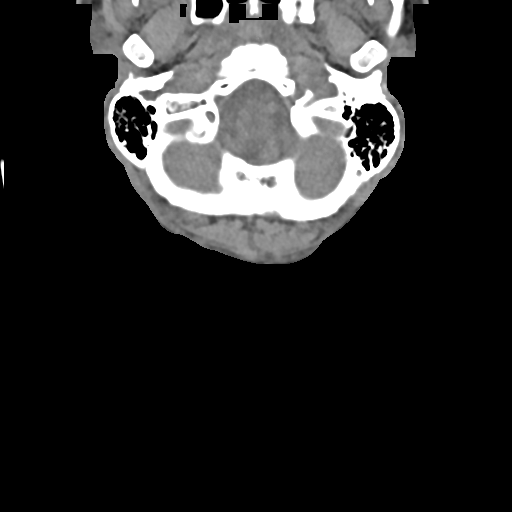

[Series 9: cor bone · coronal · 0.26mm/px · 3 of 67 slices shown]
[im 14/67  bone]
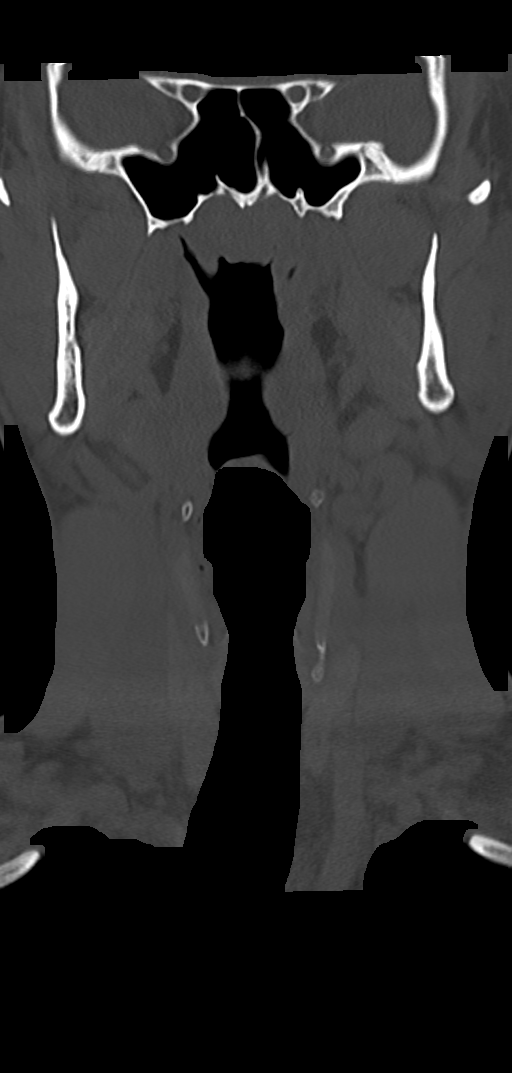
[im 27/67  bone]
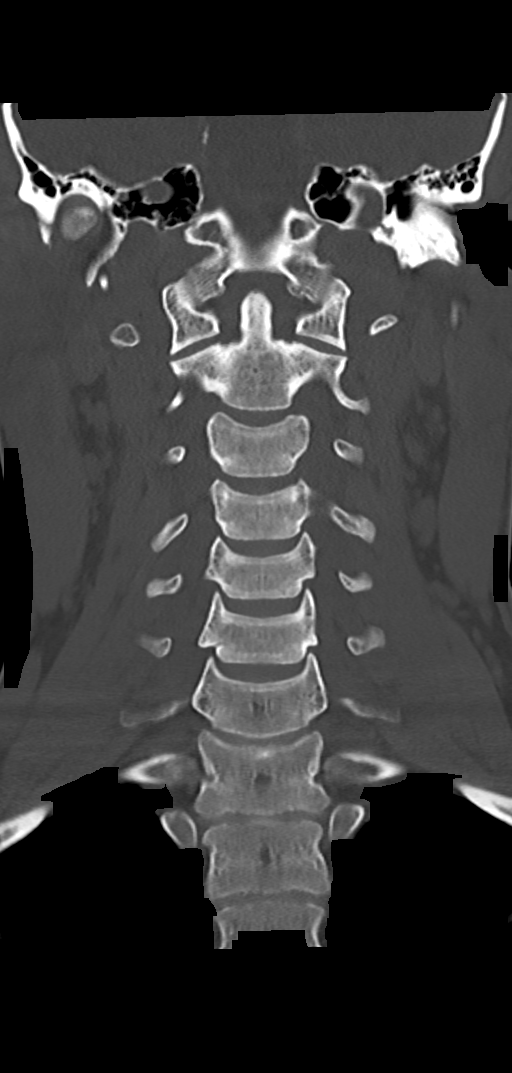
[im 40/67  bone]
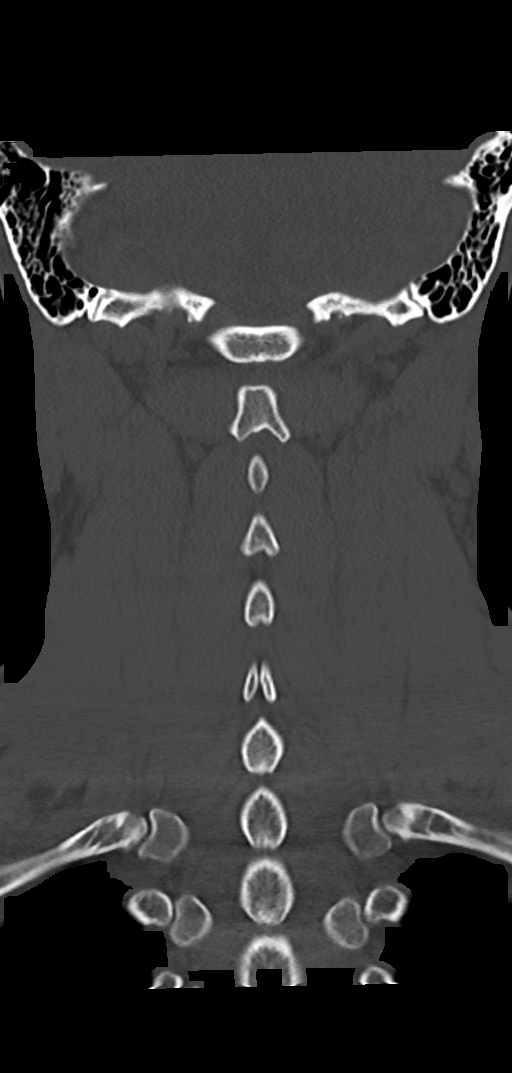

[Series 10: orthogonal axials · axial · 0.21mm/px · z∈[-352,-236]mm · 5 of 98 slices shown, 7 images]
[im 17/98  soft-tissue]
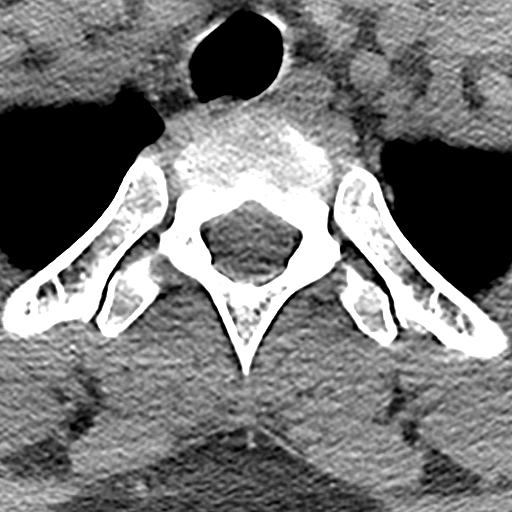
[im 17/98  bone]
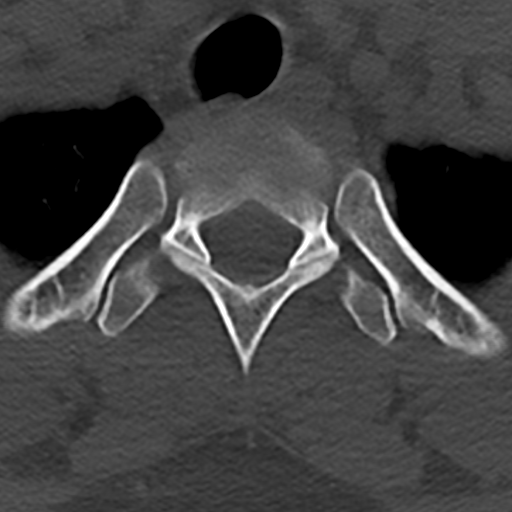
[im 33/98  bone]
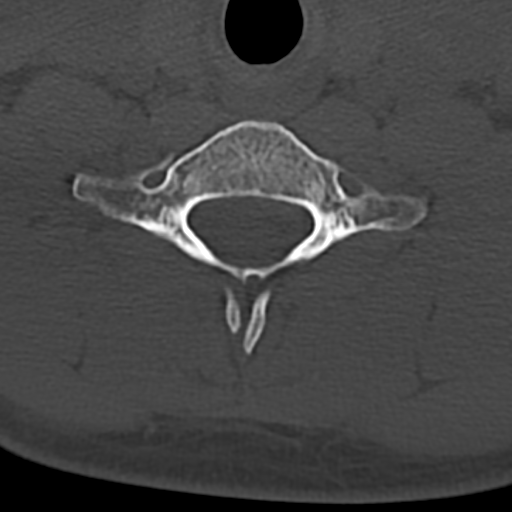
[im 49/98  bone]
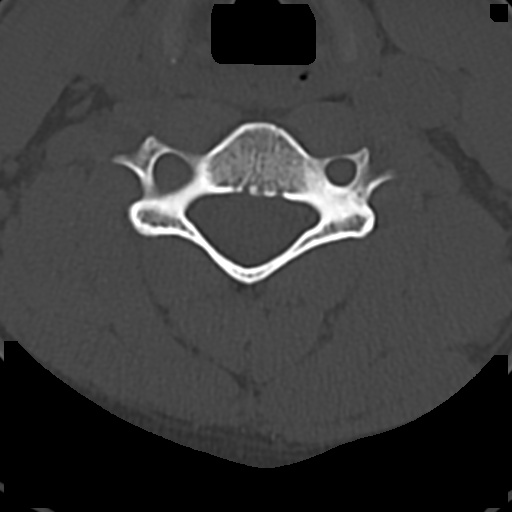
[im 65/98  bone]
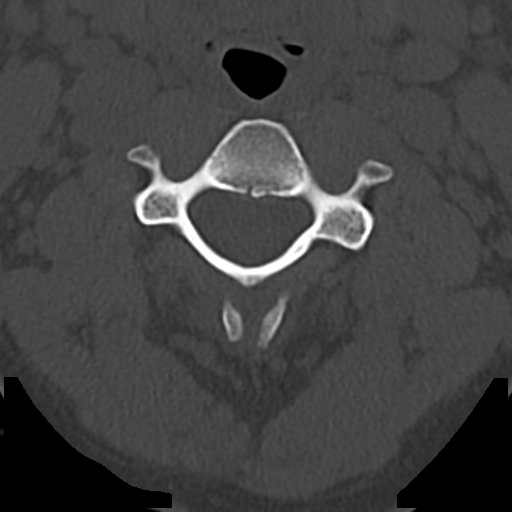
[im 81/98  soft-tissue]
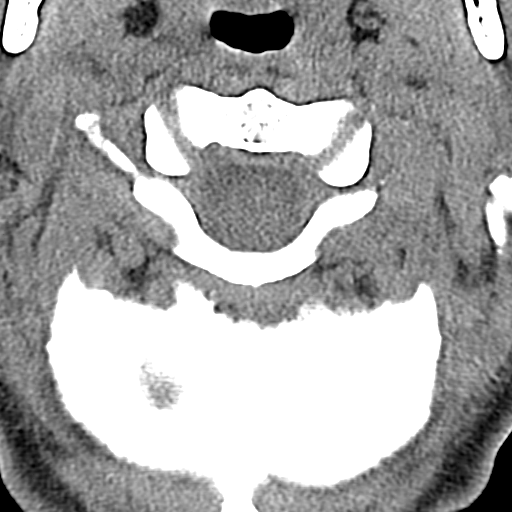
[im 81/98  bone]
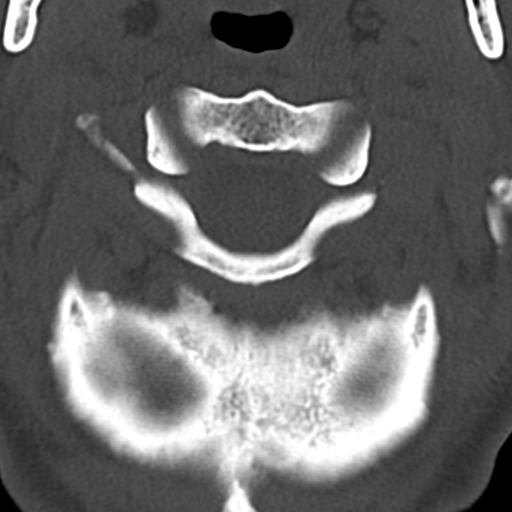

[13 of 33 positions shown; findings below may reference images not displayed]

FINDINGS: CT HEAD FINDINGS

Brain: No evidence of acute infarction, hemorrhage, hydrocephalus,
extra-axial collection or mass lesion/mass effect.

Vascular: No hyperdense vessel or unexpected calcification.

Skull: Normal. Negative for fracture or focal lesion.

Sinuses/Orbits: No acute finding.

CT CERVICAL SPINE FINDINGS

Alignment: Normal.

Skull base and vertebrae: No acute fracture. No primary bone lesion
or focal pathologic process.

Soft tissues and spinal canal: No prevertebral fluid or swelling. No
visible canal hematoma.

Disc levels:  No degenerative changes

Upper chest: Negative
IMPRESSION: No evidence of intracranial or cervical spine injury

## 2022-03-05 IMAGING — CT CT HEAD W/O CM
4 series · 16 of 47 positions shown, 18 images · non-contrast
Comparison: None.

CLINICAL DATA: Alcohol and head trauma.

EXAM:
CT HEAD WITHOUT CONTRAST
CT CERVICAL SPINE WITHOUT CONTRAST
TECHNIQUE: Multidetector CT imaging of the head and cervical spine was
performed following the standard protocol without intravenous
contrast. Multiplanar CT image reconstructions of the cervical spine
were also generated.

[Series 3: head wo · axial · 0.45mm/px · z∈[-202,-82]mm · 7 of 32 slices shown, 9 images]
[im 4/32  brain]
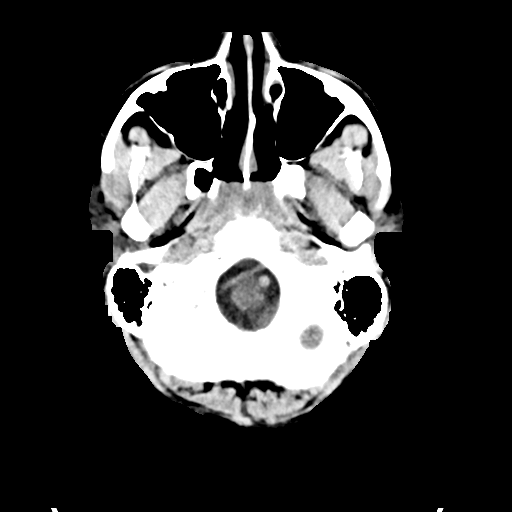
[im 4/32  bone]
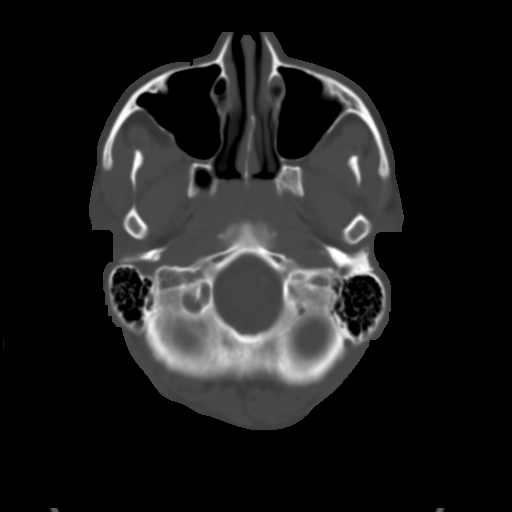
[im 8/32  brain]
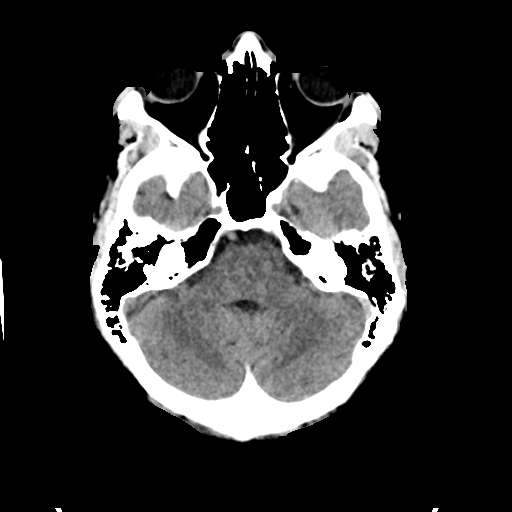
[im 12/32  brain]
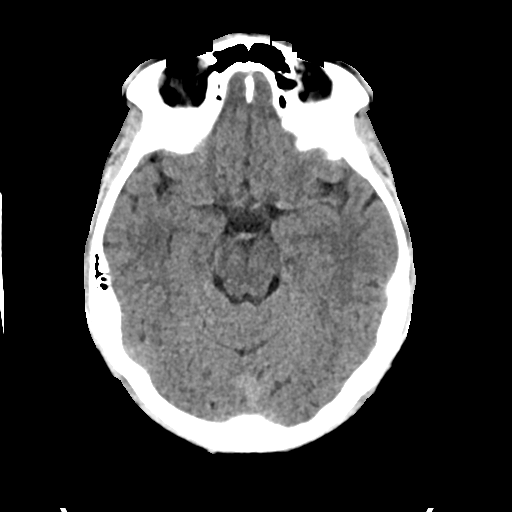
[im 16/32  brain]
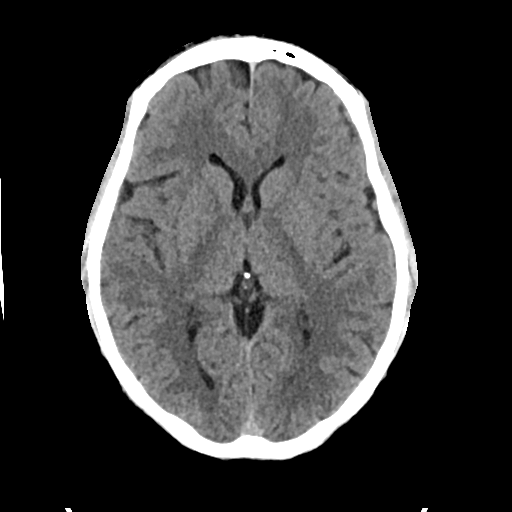
[im 20/32  brain]
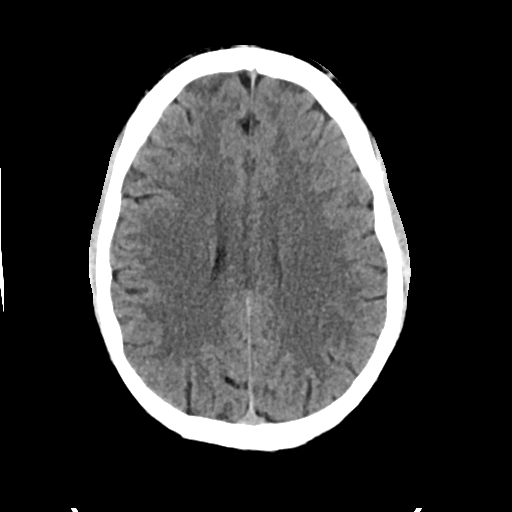
[im 20/32  bone]
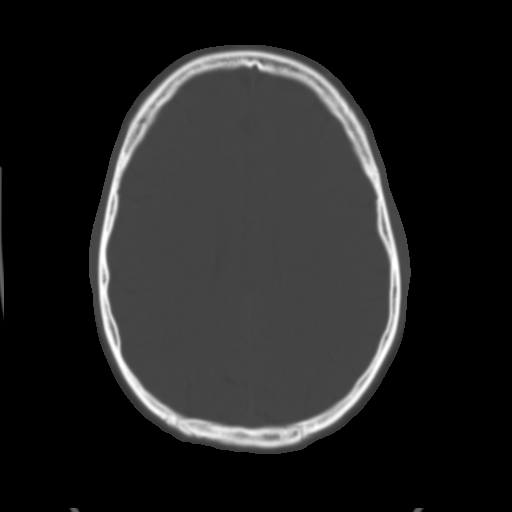
[im 24/32  brain]
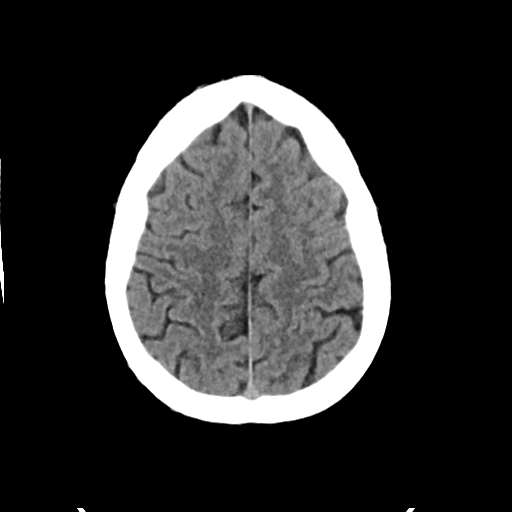
[im 28/32  brain]
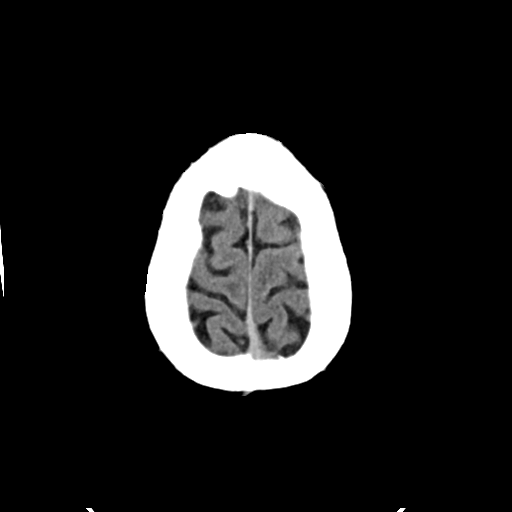

[Series 4: head bone · axial · 0.45mm/px · z∈[-203,-171]mm · 3 of 80 slices shown]
[im 8/80  bone]
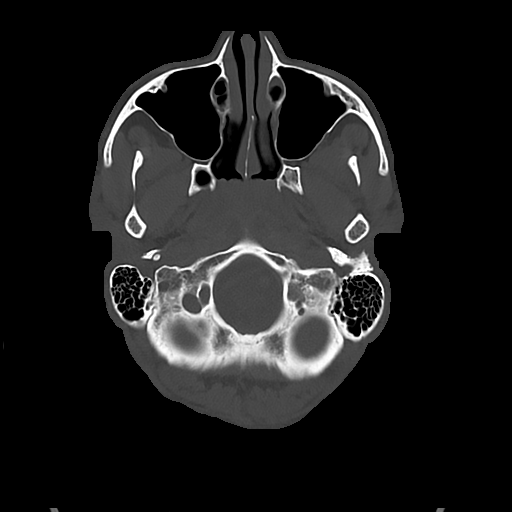
[im 16/80  bone]
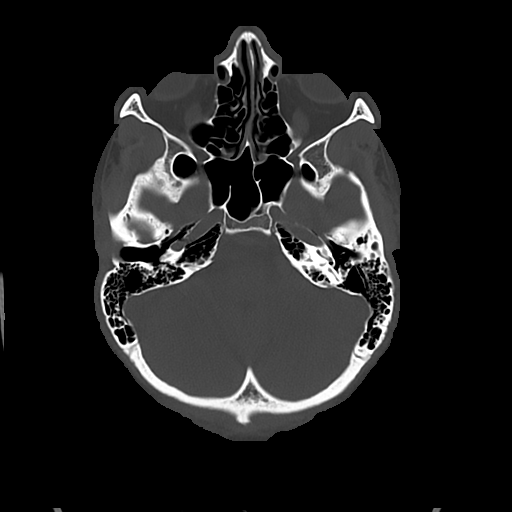
[im 24/80  bone]
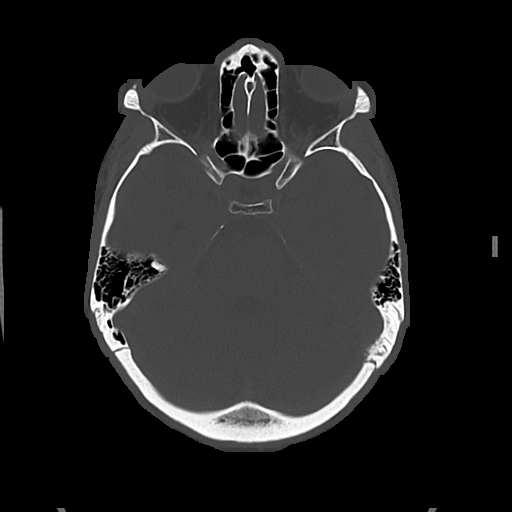

[Series 5: cor soft · coronal · 0.35mm/px · 3 of 76 slices shown]
[im 26/76  brain]
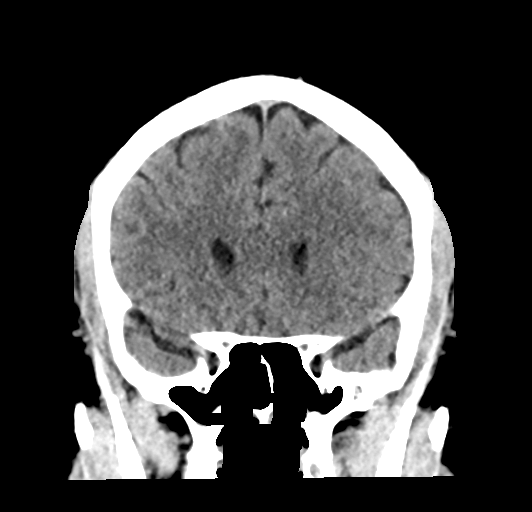
[im 34/76  brain]
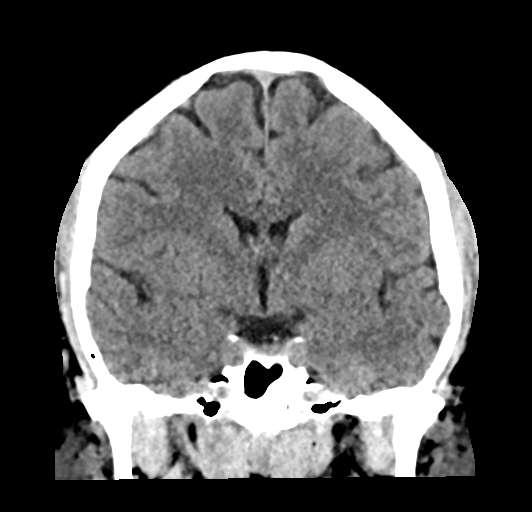
[im 42/76  brain]
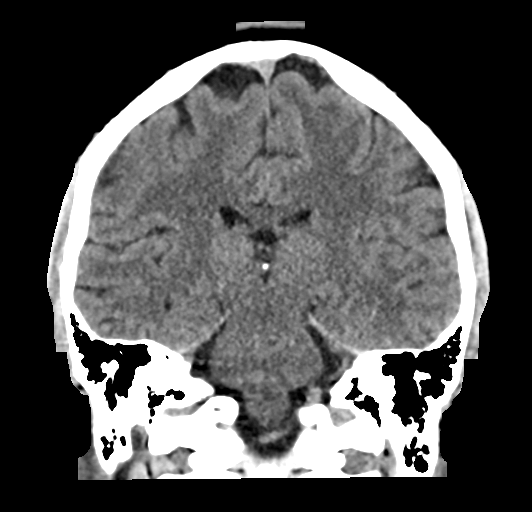

[Series 6: sag soft · sagittal · 0.35mm/px · 3 of 62 slices shown]
[im 21/62  brain]
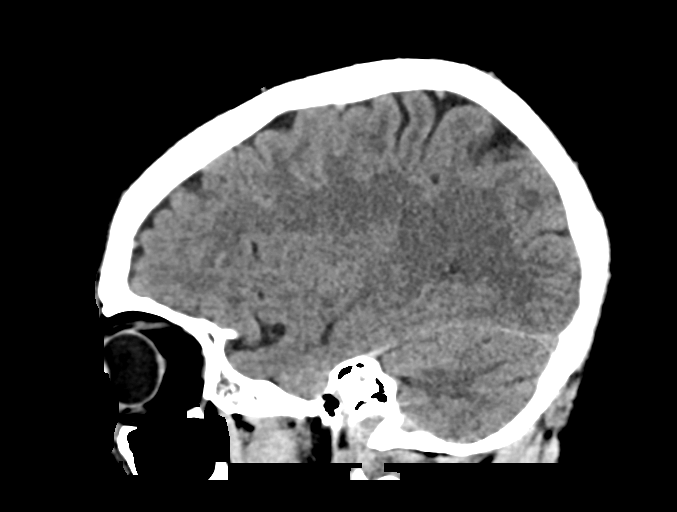
[im 31/62  brain]
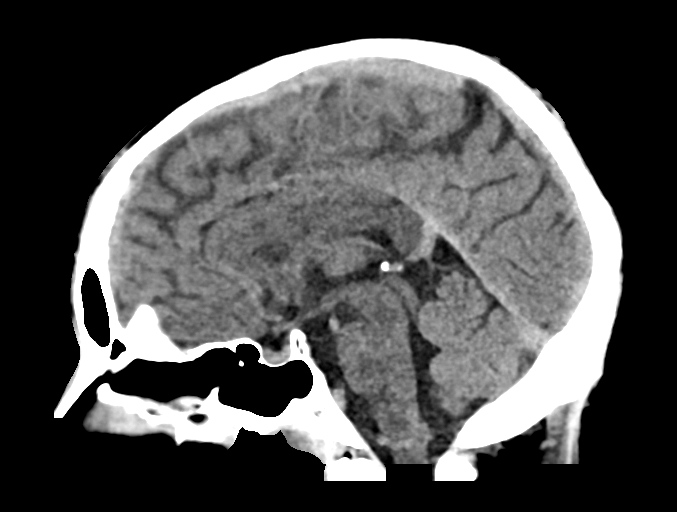
[im 41/62  brain]
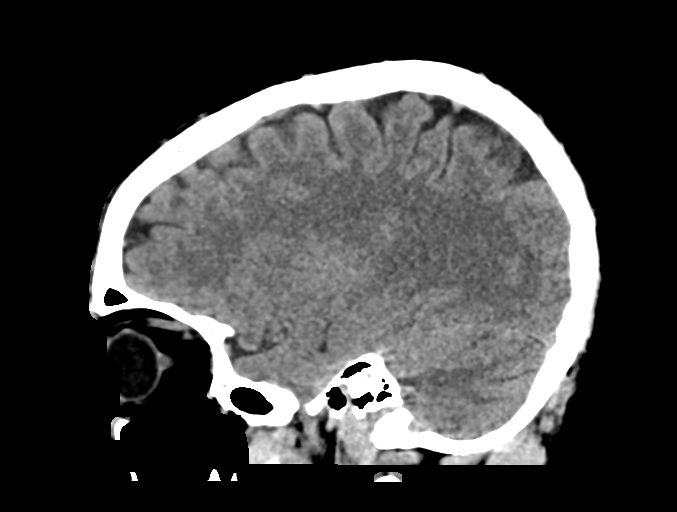

[16 of 47 positions shown; findings below may reference images not displayed]

FINDINGS: CT HEAD FINDINGS

Brain: No evidence of acute infarction, hemorrhage, hydrocephalus,
extra-axial collection or mass lesion/mass effect.

Vascular: No hyperdense vessel or unexpected calcification.

Skull: Normal. Negative for fracture or focal lesion.

Sinuses/Orbits: No acute finding.

CT CERVICAL SPINE FINDINGS

Alignment: Normal.

Skull base and vertebrae: No acute fracture. No primary bone lesion
or focal pathologic process.

Soft tissues and spinal canal: No prevertebral fluid or swelling. No
visible canal hematoma.

Disc levels:  No degenerative changes

Upper chest: Negative
IMPRESSION: No evidence of intracranial or cervical spine injury

## 2022-03-06 ENCOUNTER — Other Ambulatory Visit: Payer: Self-pay

## 2022-03-06 MED ORDER — BIKTARVY 50-200-25 MG PO TABS: 1.0000 | ORAL_TABLET | Freq: Every day | ORAL | 4 refills | Status: DC

## 2022-07-18 ENCOUNTER — Other Ambulatory Visit: Payer: Self-pay

## 2022-07-18 DIAGNOSIS — B20 Human immunodeficiency virus [HIV] disease: Secondary | ICD-10-CM

## 2022-07-18 MED ORDER — BIKTARVY 50-200-25 MG PO TABS: 1.0000 | ORAL_TABLET | Freq: Every day | ORAL | 0 refills | Status: DC

## 2022-07-27 ENCOUNTER — Ambulatory Visit (INDEPENDENT_AMBULATORY_CARE_PROVIDER_SITE_OTHER): Payer: Self-pay | Admitting: Pharmacist

## 2022-07-27 ENCOUNTER — Other Ambulatory Visit: Payer: Self-pay

## 2022-07-27 ENCOUNTER — Encounter: Payer: Self-pay | Admitting: Infectious Disease

## 2022-07-27 DIAGNOSIS — B20 Human immunodeficiency virus [HIV] disease: Secondary | ICD-10-CM

## 2022-07-27 MED ORDER — BIKTARVY 50-200-25 MG PO TABS: 1.0000 | ORAL_TABLET | Freq: Every day | ORAL | 5 refills | Status: DC

## 2022-07-27 NOTE — Progress Notes (Signed)
HPI: Ethan Mitchell is a 21 y.o. male who presents to the [facility] for HIV follow-up.  Patient Active Problem List   Diagnosis Date Noted   HIV disease ([place]) 04/11/2021    Patient's Medications  New Prescriptions   No medications on file  Previous Medications   No medications on file  Modified Medications   Modified Medication Previous Medication   BICTEGRAVIR-EMTRICITABINE-TENOFOVIR AF (BIKTARVY) 50-200-25 MG TABS TABLET bictegravir-emtricitabine-tenofovir AF (BIKTARVY) 50-200-25 MG TABS tablet      Take 1 tablet by mouth daily.    Take 1 tablet by mouth daily. NO FURTHER REFILLS UNTIL SEEN IN OFFICE  Discontinued Medications   No medications on file    Allergies: No Known Allergies  Past Medical History: Past Medical History:  Diagnosis Date   HIV disease ([place]) 04/11/2021    Social History: Social History   Socioeconomic History   Marital status: Single    Spouse name: Not on file   Number of children: Not on file   Years of education: Not on file   Highest education level: Not on file  Occupational History   Not on file  Tobacco Use   Smoking status: Some Days    Types: E-cigarettes   Smokeless tobacco: Never   Tobacco comments:    social smoker, sometimes vapes   Substance and Sexual Activity   Alcohol use: Yes    Comment: socially    Drug use: Yes    Types: Marijuana    Comment: daily    Sexual activity: Not Currently    Partners: Male  Other Topics Concern   Not on file  Social History Narrative   Not on file   Social Determinants of Health   Financial Resource Strain: Not on file  Food Insecurity: Not on file  Transportation Needs: Not on file  Physical Activity: Not on file  Stress: Not on file  Social Connections: Not on file    Labs: Lab Results  Component Value Date   HIV1RNAQUANT <20 (H) 04/11/2021   HIV1RNAQUANT 22,600 (H) 02/27/2021   CD4TABS 776 04/11/2021   CD4TABS 769 02/27/2021    RPR and STI Lab Results   Component Value Date   LABRPR REACTIVE (A) 02/27/2021   RPRTITER 1:4 (H) 02/27/2021    STI Results GC CT  02/27/2021  9:16 AM Negative  Negative     Hepatitis B Lab Results  Component Value Date   HEPBSAG NON-REACTIVE 02/27/2021   Hepatitis C Lab Results  Component Value Date   HEPCAB NON-REACTIVE 02/27/2021   Hepatitis A No results found for: "HAV" Lipids: No results found for: "CHOL", "TRIG", "HDL", "CHOLHDL", "VLDL", "LDLCALC"  Current HIV Regimen: Biktarvy  Assessment: Regie presents to clinic today for HIV follow-up. He last followed up with Dr. Tommy Medal in July 2022 and states he has not had any issues since then. Viral load has maintained perfect suppression since May 2022, and CD4 count has remained > 400 as well. He fills Biktarvy consistently and denies missing any doses or experiencing any adverse effects. He met with Juliann Pulse today as he previously was insured through [employer]; she helped him set up another marketplace plan which should go into effect next week. Encouraged him to reach out to [place] if his refills are expensive following marketplace change. Sent refills to [facility] on [address]. He states he has been with one new partner since he was last here and requested full STI screening. Will check HIV RNA, CD4  count, CMP, CBC, lipid panel, RPR, and urine/oral/rectal cytologies. He requested to follow up with Dr. Tommy Medal in the next few weeks for further medication discussion.  Of note, patient is eligible for HAV vaccination series; he is interested but deferred today. Stated he would like to discuss with Dr. Tommy Medal. Also cannot find documented HBV surface antibody in Epic so will check that today.   Plan: Continue Biktarvy (refills sent to [facility]) Check HIV RNA, CD4 count, CBC, CMP, lipid panel, RPR, HBV sAb, and urine/oral/rectal cytologies  Follow up with Dr. Tommy Medal in 2 weeks   Alfonse Spruce, PharmD, CPP, [facility] 07/27/2022, 12:07 PM

## 2022-08-01 LAB — T-HELPER CELLS (CD4) COUNT (NOT AT ARMC)
Absolute CD4: 1160 cells/uL (ref 490–1740)
CD4 T Helper %: 42 % (ref 30–61)
Total lymphocyte count: 2766 cells/uL (ref 850–3900)

## 2022-08-01 LAB — LIPID PANEL
Cholesterol: 127 mg/dL (ref <200)
HDL: 46 mg/dL (ref >=40)
LDL Cholesterol (Calc): 67 mg/dL (calc)
Non-HDL Cholesterol (Calc): 81 mg/dL (calc) (ref <130)
Total CHOL/HDL Ratio: 2.8 (calc) (ref <5.0)
Triglycerides: 55 mg/dL (ref <150)

## 2022-08-01 LAB — CBC WITH DIFFERENTIAL/PLATELET
Absolute Monocytes: 462 cells/uL (ref 200–950)
Basophils Absolute: 23 cells/uL (ref 0–200)
Basophils Relative: 0.4 %
Eosinophils Absolute: 80 cells/uL (ref 15–500)
Eosinophils Relative: 1.4 %
HCT: 36.9 % — ABNORMAL LOW (ref 38.5–50.0)
Hemoglobin: 11.8 g/dL — ABNORMAL LOW (ref 13.2–17.1)
Lymphs Abs: 2554 cells/uL (ref 850–3900)
MCH: 25.8 pg — ABNORMAL LOW (ref 27.0–33.0)
MCHC: 32 g/dL (ref 32.0–36.0)
MCV: 80.7 fL (ref 80.0–100.0)
MPV: 9.4 fL (ref 7.5–12.5)
Monocytes Relative: 8.1 %
Neutro Abs: 2582 cells/uL (ref 1500–7800)
Neutrophils Relative %: 45.3 %
Platelets: 320 10*3/uL (ref 140–400)
RBC: 4.57 10*6/uL (ref 4.20–5.80)
RDW: 13.9 % (ref 11.0–15.0)
Total Lymphocyte: 44.8 %
WBC: 5.7 10*3/uL (ref 3.8–10.8)

## 2022-08-01 LAB — RPR TITER: RPR Titer: 1:1 {titer} — ABNORMAL HIGH

## 2022-08-01 LAB — COMPREHENSIVE METABOLIC PANEL
AG Ratio: 1.8 (calc) (ref 1.0–2.5)
ALT: 14 U/L (ref 9–46)
AST: 21 U/L (ref 10–40)
Albumin: 4.7 g/dL (ref 3.6–5.1)
Alkaline phosphatase (APISO): 62 U/L (ref 36–130)
BUN: 9 mg/dL (ref 7–25)
CO2: 27 mmol/L (ref 20–32)
Calcium: 9.3 mg/dL (ref 8.6–10.3)
Chloride: 105 mmol/L (ref 98–110)
Creat: 1.12 mg/dL (ref 0.60–1.24)
Globulin: 2.6 g/dL (calc) (ref 1.9–3.7)
Glucose, Bld: 92 mg/dL (ref 65–99)
Potassium: 4.2 mmol/L (ref 3.5–5.3)
Sodium: 137 mmol/L (ref 135–146)
Total Bilirubin: 0.4 mg/dL (ref 0.2–1.2)
Total Protein: 7.3 g/dL (ref 6.1–8.1)

## 2022-08-01 LAB — HIV-1 RNA QUANT-NO REFLEX-BLD
HIV 1 RNA Quant: 25 Copies/mL — ABNORMAL HIGH
HIV-1 RNA Quant, Log: 1.39 Log cps/mL — ABNORMAL HIGH

## 2022-08-01 LAB — C. TRACHOMATIS/N. GONORRHOEAE RNA
C. trachomatis RNA, TMA: NOT DETECTED
N. gonorrhoeae RNA, TMA: NOT DETECTED

## 2022-08-01 LAB — HEPATITIS B SURFACE ANTIBODY,QUALITATIVE: Hep B S Ab: NONREACTIVE

## 2022-08-01 LAB — GC/CHLAMYDIA PROBE, AMP (THROAT)
Chlamydia trachomatis RNA: NOT DETECTED
Neisseria gonorrhoeae RNA: NOT DETECTED

## 2022-08-01 LAB — CT/NG RNA, TMA RECTAL
Chlamydia Trachomatis RNA: NOT DETECTED
Neisseria Gonorrhoeae RNA: NOT DETECTED

## 2022-08-01 LAB — RPR: RPR Ser Ql: REACTIVE — AB

## 2022-08-01 LAB — FLUORESCENT TREPONEMAL AB(FTA)-IGG-BLD: Fluorescent Treponemal ABS: REACTIVE — AB

## 2022-08-15 ENCOUNTER — Encounter: Payer: Self-pay | Admitting: Infectious Disease

## 2022-08-15 ENCOUNTER — Other Ambulatory Visit: Payer: Self-pay

## 2022-08-15 ENCOUNTER — Ambulatory Visit: Payer: Self-pay | Admitting: Infectious Disease

## 2022-08-15 VITALS — BP 148/88 | HR 56 | Temp 98.2°F | Ht 71.0 in | Wt 184.0 lb

## 2022-08-15 DIAGNOSIS — B20 Human immunodeficiency virus [HIV] disease: Secondary | ICD-10-CM

## 2022-08-15 DIAGNOSIS — A539 Syphilis, unspecified: Secondary | ICD-10-CM

## 2022-08-15 DIAGNOSIS — Z7185 Encounter for immunization safety counseling: Secondary | ICD-10-CM

## 2022-08-15 HISTORY — DX: Syphilis, unspecified: A53.9

## 2022-08-15 HISTORY — DX: Encounter for immunization safety counseling: Z71.85

## 2022-08-15 MED ORDER — BIKTARVY 50-200-25 MG PO TABS: 1.0000 | ORAL_TABLET | Freq: Every day | ORAL | 11 refills | Status: DC

## 2022-08-15 NOTE — Progress Notes (Signed)
Chief complaint: Follow-up for HIV disease on Biktarvy s   Patient ID: Ethan Mitchell, male    DOB: 11/15/2001, 21 y.o.   MRN: [id number]  HPI  Ethan Mitchell is a 21 year old black man recently diagnosed with HIV, placed on Biktarvy with rapid and perfect virological suppression.   He had not come back for follow-up since July 2022.  He had insurance and was obtaining his BIKTARVY but recently lost that and had to meet with Ethan Mitchell to get enrolled in Ethan Mitchell and H MAP.  He met with Ethan Mitchell who got labs checked including STI testing.  Patient remains virologically suppressed.  He is not immune to hep a and B and Ethan Mitchell had suggested vaccination against these viruses which I also recommend.  The patient did not want them today but will do this in the future.  I also discussed the [employer] 052 switch study that he would be eligible for.       Past Medical History:  Diagnosis Date   HIV disease ([place]) 04/11/2021   Syphilis 08/15/2022   Vaccine counseling 08/15/2022       Social History   Socioeconomic History   Marital status: Single    Spouse name: Not on file   Number of children: Not on file   Years of education: Not on file   Highest education level: Not on file  Occupational History   Not on file  Tobacco Use   Smoking status: Some Days    Types: E-cigarettes, Cigars   Smokeless tobacco: Never   Tobacco comments:    social smoker, sometimes vapes;    States cigars  Substance and Sexual Activity   Alcohol use: Yes    Comment: socially    Drug use: Yes    Types: Marijuana    Comment: daily    Sexual activity: Not Currently    Partners: Male    Comment: declined condoms  Other Topics Concern   Not on file  Social History Narrative   Not on file   Social Determinants of Health   Financial Resource Strain: Not on file  Food Insecurity: Not on file  Transportation Needs: Not on file  Physical Activity: Not on file  Stress: Not on file  Social Connections: Not on  file    No Known Allergies   Current Outpatient Medications:    bictegravir-emtricitabine-tenofovir AF (BIKTARVY) 50-200-25 MG TABS tablet, Take 1 tablet by mouth daily., Disp: 30 tablet, Rfl: 5    Review of Systems  Constitutional:  Negative for activity change, appetite change, chills, diaphoresis, fatigue, fever and unexpected weight change.  HENT:  Negative for congestion, rhinorrhea, sinus pressure, sneezing, sore throat and trouble swallowing.   Eyes:  Negative for photophobia and visual disturbance.  Respiratory:  Negative for cough, chest tightness, shortness of breath, wheezing and stridor.   Cardiovascular:  Negative for chest pain, palpitations and leg swelling.  Gastrointestinal:  Negative for abdominal distention, abdominal pain, anal bleeding, blood in stool, constipation, diarrhea, nausea and vomiting.  Genitourinary:  Negative for difficulty urinating, dysuria, flank pain and hematuria.  Musculoskeletal:  Negative for arthralgias, back pain, gait problem, joint swelling and myalgias.  Skin:  Negative for color change, pallor, rash and wound.  Neurological:  Negative for dizziness, tremors, weakness and light-headedness.  Hematological:  Negative for adenopathy. Does not bruise/bleed easily.  Psychiatric/Behavioral:  Negative for agitation, behavioral problems, confusion, decreased concentration, dysphoric mood and sleep disturbance.        Objective:   Physical Exam  Constitutional:      Appearance: He is well-developed.  HENT:     Head: Normocephalic and atraumatic.  Eyes:     Conjunctiva/sclera: Conjunctivae normal.  Cardiovascular:     Rate and Rhythm: Normal rate and regular rhythm.  Pulmonary:     Effort: Pulmonary effort is normal. No respiratory distress.     Breath sounds: No wheezing.  Abdominal:     General: There is no distension.     Palpations: Abdomen is soft.  Musculoskeletal:        General: No tenderness. Normal range of motion.      Cervical back: Normal range of motion and neck supple.  Skin:    General: Skin is warm and dry.     Coloration: Skin is not pale.     Findings: No erythema or rash.  Neurological:     General: No focal deficit present.     Mental Status: He is alert and oriented to person, place, and time.  Psychiatric:        Mood and Affect: Mood normal.        Behavior: Behavior normal.        Thought Content: Thought content normal.        Judgment: Judgment normal.           Assessment & Plan:   HIV disease:  I have reviewed Ethan Mitchell's labs including viral load which was  Lab Results  Component Value Date   HIV1RNAQUANT 25 (H) 07/27/2022   and cd4 which was  Lab Results  Component Value Date   CD4TABS 776 04/11/2021     I am continuing patient's prescription for Biktarvy   Syphilis: he is now sero-fast  Vaccine counseling: Hep a and B vaccines as well as flu shot annually and new COVID vaccine.

## 2022-11-14 ENCOUNTER — Ambulatory Visit: Payer: Self-pay | Admitting: Infectious Disease

## 2022-12-14 ENCOUNTER — Encounter: Payer: Self-pay | Admitting: Infectious Disease

## 2022-12-14 ENCOUNTER — Ambulatory Visit: Payer: Commercial Managed Care - HMO | Admitting: Infectious Disease

## 2022-12-14 ENCOUNTER — Other Ambulatory Visit (HOSPITAL_COMMUNITY): Payer: Self-pay

## 2022-12-14 ENCOUNTER — Other Ambulatory Visit: Payer: Self-pay

## 2022-12-14 VITALS — BP 122/70 | HR 83 | Temp 97.3°F | Ht 70.0 in | Wt 179.0 lb

## 2022-12-14 DIAGNOSIS — Z7185 Encounter for immunization safety counseling: Secondary | ICD-10-CM

## 2022-12-14 DIAGNOSIS — B20 Human immunodeficiency virus [HIV] disease: Secondary | ICD-10-CM

## 2022-12-14 DIAGNOSIS — A539 Syphilis, unspecified: Secondary | ICD-10-CM

## 2022-12-14 DIAGNOSIS — Z113 Encounter for screening for infections with a predominantly sexual mode of transmission: Secondary | ICD-10-CM | POA: Diagnosis not present

## 2022-12-14 MED ORDER — BIKTARVY 50-200-25 MG PO TABS: 1.0000 | ORAL_TABLET | Freq: Every day | ORAL | 11 refills | Status: DC

## 2022-12-14 NOTE — Progress Notes (Signed)
Chief complaint: Follow-up for HIV disease on Biktarvy  Patient ID: Ethan Mitchell, male    DOB: 2001/05/16, 22 y.o.   MRN: [id number]  HPI  Ethan Mitchell is a 22 year old black man recently diagnosed with HIV, placed on Biktarvy with rapid and perfect virological suppression.   He had not come back for follow-up since July 2022 butI then saw him in Sepember 2023.    He is now on PCAP.  Commended vaccination and flu and COVID but he did not want to get these today.  He was wanting to be tested for STIs genital necks or genital sites.  We did talk about doxycycline for postexposure prophylaxis as well.           Past Medical History:  Diagnosis Date   HIV disease ([place]) 04/11/2021   Syphilis 08/15/2022   Vaccine counseling 08/15/2022       Social History   Socioeconomic History   Marital status: Single    Spouse name: Not on file   Number of children: Not on file   Years of education: Not on file   Highest education level: Not on file  Occupational History   Not on file  Tobacco Use   Smoking status: Some Days    Types: E-cigarettes, Cigars   Smokeless tobacco: Never   Tobacco comments:    social smoker, sometimes vapes;    States cigars  Substance and Sexual Activity   Alcohol use: Yes    Comment: socially    Drug use: Yes    Types: Marijuana    Comment: daily    Sexual activity: Not Currently    Partners: Male    Comment: declined condoms  Other Topics Concern   Not on file  Social History Narrative   Not on file   Social Determinants of Health   Financial Resource Strain: Not on file  Food Insecurity: Not on file  Transportation Needs: Not on file  Physical Activity: Not on file  Stress: Not on file  Social Connections: Not on file    No Known Allergies   Current Outpatient Medications:    bictegravir-emtricitabine-tenofovir AF (BIKTARVY) 50-200-25 MG TABS tablet, Take 1 tablet by mouth daily., Disp: 30 tablet, Rfl: 11    Review of Systems   Constitutional:  Negative for activity change, appetite change, chills, diaphoresis, fatigue, fever and unexpected weight change.  HENT:  Negative for congestion, rhinorrhea, sinus pressure, sneezing, sore throat and trouble swallowing.   Eyes:  Negative for photophobia and visual disturbance.  Respiratory:  Negative for cough, chest tightness, shortness of breath, wheezing and stridor.   Cardiovascular:  Negative for chest pain, palpitations and leg swelling.  Gastrointestinal:  Negative for abdominal distention, abdominal pain, anal bleeding, blood in stool, constipation, diarrhea, nausea and vomiting.  Genitourinary:  Negative for difficulty urinating, dysuria, flank pain and hematuria.  Musculoskeletal:  Negative for arthralgias, back pain, gait problem, joint swelling and myalgias.  Skin:  Negative for color change, pallor, rash and wound.  Neurological:  Negative for dizziness, tremors, weakness and light-headedness.  Hematological:  Negative for adenopathy. Does not bruise/bleed easily.  Psychiatric/Behavioral:  Negative for agitation, behavioral problems, confusion, decreased concentration, dysphoric mood and sleep disturbance.        Objective:   Physical Exam Constitutional:      Appearance: He is well-developed.  HENT:     Head: Normocephalic and atraumatic.  Eyes:     Conjunctiva/sclera: Conjunctivae normal.  Cardiovascular:     Rate and Rhythm: Normal  rate and regular rhythm.  Pulmonary:     Effort: Pulmonary effort is normal. No respiratory distress.     Breath sounds: No wheezing.  Abdominal:     General: There is no distension.     Palpations: Abdomen is soft.  Musculoskeletal:        General: No tenderness. Normal range of motion.     Cervical back: Normal range of motion and neck supple.  Skin:    General: Skin is warm and dry.     Coloration: Skin is not pale.     Findings: No erythema or rash.  Neurological:     General: No focal deficit present.      Mental Status: He is alert and oriented to person, place, and time.     Coordination: Abnormal coordination: Again we will repeat RPRSTI screening screen for gonorrhea chlamydia genital extragenital sites.  Psychiatric:        Mood and Affect: Mood normal.        Behavior: Behavior normal.        Thought Content: Thought content normal.        Judgment: Judgment normal.           Assessment & Plan:    HIV disease:  I will add order HIV viral load CD4 count CBC with differential CMP, RPR GC and chlamydia and I will continue  Ethan Mitchell's Biktarvy,  prescription    Syphilis was serofast at last check  we will repeat RPRSTI screening screen for gonorrhea chlamydia genital extragenital sites  Discussed doxy PEP  Vaccine counseling recommended flu and covid 19 vaccines

## 2022-12-15 LAB — C. TRACHOMATIS/N. GONORRHOEAE RNA
C. trachomatis RNA, TMA: NOT DETECTED
N. gonorrhoeae RNA, TMA: NOT DETECTED

## 2022-12-15 LAB — GC/CHLAMYDIA PROBE, AMP (THROAT)
Chlamydia trachomatis RNA: NOT DETECTED
Neisseria gonorrhoeae RNA: NOT DETECTED

## 2022-12-17 LAB — CBC WITH DIFFERENTIAL/PLATELET
Absolute Monocytes: 333 cells/uL (ref 200–950)
Basophils Absolute: 18 cells/uL (ref 0–200)
Basophils Relative: 0.4 %
Eosinophils Absolute: 50 cells/uL (ref 15–500)
Eosinophils Relative: 1.1 %
HCT: 36.9 % — ABNORMAL LOW (ref 38.5–50.0)
Hemoglobin: 12.8 g/dL — ABNORMAL LOW (ref 13.2–17.1)
Lymphs Abs: 2232 cells/uL (ref 850–3900)
MCH: 27.4 pg (ref 27.0–33.0)
MCHC: 34.7 g/dL (ref 32.0–36.0)
MCV: 79 fL — ABNORMAL LOW (ref 80.0–100.0)
MPV: 8.9 fL (ref 7.5–12.5)
Monocytes Relative: 7.4 %
Neutro Abs: 1868 cells/uL (ref 1500–7800)
Neutrophils Relative %: 41.5 %
Platelets: 389 10*3/uL (ref 140–400)
RBC: 4.67 10*6/uL (ref 4.20–5.80)
RDW: 13.6 % (ref 11.0–15.0)
Total Lymphocyte: 49.6 %
WBC: 4.5 10*3/uL (ref 3.8–10.8)

## 2022-12-17 LAB — COMPLETE METABOLIC PANEL WITH GFR
AG Ratio: 1.5 (calc) (ref 1.0–2.5)
ALT: 10 U/L (ref 9–46)
AST: 16 U/L (ref 10–40)
Albumin: 4.5 g/dL (ref 3.6–5.1)
Alkaline phosphatase (APISO): 55 U/L (ref 36–130)
BUN: 8 mg/dL (ref 7–25)
CO2: 28 mmol/L (ref 20–32)
Calcium: 9.3 mg/dL (ref 8.6–10.3)
Chloride: 104 mmol/L (ref 98–110)
Creat: 1.17 mg/dL (ref 0.60–1.24)
Globulin: 3 g/dL (calc) (ref 1.9–3.7)
Glucose, Bld: 102 mg/dL — ABNORMAL HIGH (ref 65–99)
Potassium: 4 mmol/L (ref 3.5–5.3)
Sodium: 136 mmol/L (ref 135–146)
Total Bilirubin: 0.3 mg/dL (ref 0.2–1.2)
Total Protein: 7.5 g/dL (ref 6.1–8.1)
eGFR: 90 mL/min/{1.73_m2} (ref >=60)

## 2022-12-17 LAB — T-HELPER CELLS (CD4) COUNT (NOT AT ARMC)
Absolute CD4: 895 cells/uL (ref 490–1740)
CD4 T Helper %: 37 % (ref 30–61)
Total lymphocyte count: 2388 cells/uL (ref 850–3900)

## 2022-12-17 LAB — T PALLIDUM AB: T Pallidum Abs: POSITIVE — AB

## 2022-12-17 LAB — RPR: RPR Ser Ql: REACTIVE — AB

## 2022-12-17 LAB — RPR TITER: RPR Titer: 1:1 {titer} — ABNORMAL HIGH

## 2022-12-17 LAB — HIV-1 RNA QUANT-NO REFLEX-BLD
HIV 1 RNA Quant: NOT DETECTED Copies/mL
HIV-1 RNA Quant, Log: NOT DETECTED Log cps/mL

## 2022-12-17 LAB — LIPID PANEL
Cholesterol: 166 mg/dL (ref <200)
HDL: 40 mg/dL (ref >=40)
LDL Cholesterol (Calc): 111 mg/dL (calc) — ABNORMAL HIGH
Non-HDL Cholesterol (Calc): 126 mg/dL (calc) (ref <130)
Total CHOL/HDL Ratio: 4.2 (calc) (ref <5.0)
Triglycerides: 61 mg/dL (ref <150)

## 2023-07-08 ENCOUNTER — Ambulatory Visit: Payer: Self-pay | Admitting: Infectious Disease

## 2023-07-30 NOTE — Progress Notes (Signed)
Chief complaint: For HIV disease on medications desiring STI testing  Patient ID: Ethan Mitchell, male    DOB: 22-Sep-2001, 22 y.o.   MRN: [id number]  HPI  Ethan Mitchell is a 22 year-old black man recently diagnosed with HIV, placed on Biktarvy with rapid and perfect virological suppression.   He had not come back for follow-up since July 2022 butI then saw him in [place] 2023.    He is now on PCAP.   He needs to meet with financial counselor today.  He is wanting STI screening which we will do.           Past Medical History:  Diagnosis Date   HIV disease ([facility]) 04/11/2021   Syphilis 08/15/2022   Vaccine counseling 08/15/2022       Social History   Socioeconomic History   Marital status: Single    Spouse name: Not on file   Number of children: Not on file   Years of education: Not on file   Highest education level: Not on file  Occupational History   Not on file  Tobacco Use   Smoking status: Some Days    Types: E-cigarettes, Cigars   Smokeless tobacco: Never   Tobacco comments:    social smoker, sometimes vapes;    States cigars  Substance and Sexual Activity   Alcohol use: Yes    Comment: socially    Drug use: Yes    Types: Marijuana    Comment: daily    Sexual activity: Not Currently    Partners: Male    Comment: declined condoms  Other Topics Concern   Not on file  Social History Narrative   Not on file   Social Determinants of Health   Financial Resource Strain: Not on file  Food Insecurity: Not on file  Transportation Needs: Not on file  Physical Activity: Not on file  Stress: Not on file  Social Connections: Not on file    No Known Allergies   Current Outpatient Medications:    bictegravir-emtricitabine-tenofovir AF (BIKTARVY) 50-200-25 MG TABS tablet, Take 1 tablet by mouth daily., Disp: 30 tablet, Rfl: 11    Review of Systems  Constitutional:  Negative for activity change, appetite change, chills, diaphoresis, fatigue, fever and  unexpected weight change.  HENT:  Negative for congestion, rhinorrhea, sinus pressure, sneezing, sore throat and trouble swallowing.   Eyes:  Negative for photophobia and visual disturbance.  Respiratory:  Negative for cough, chest tightness, shortness of breath, wheezing and stridor.   Cardiovascular:  Negative for chest pain, palpitations and leg swelling.  Gastrointestinal:  Negative for abdominal distention, abdominal pain, anal bleeding, blood in stool, constipation, diarrhea, nausea and vomiting.  Genitourinary:  Negative for difficulty urinating, dysuria, flank pain and hematuria.  Musculoskeletal:  Negative for arthralgias, back pain, gait problem, joint swelling and myalgias.  Skin:  Negative for color change, pallor, rash and wound.  Neurological:  Negative for dizziness, tremors, weakness and light-headedness.  Hematological:  Negative for adenopathy. Does not bruise/bleed easily.  Psychiatric/Behavioral:  Negative for agitation, behavioral problems, confusion, decreased concentration, dysphoric mood and sleep disturbance.        Objective:   Physical Exam Constitutional:      Appearance: He is well-developed.  HENT:     Head: Normocephalic and atraumatic.  Eyes:     Conjunctiva/sclera: Conjunctivae normal.  Cardiovascular:     Rate and Rhythm: Normal rate and regular rhythm.  Pulmonary:     Effort: Pulmonary effort is normal. No respiratory distress.  Breath sounds: No wheezing.  Abdominal:     General: There is no distension.     Palpations: Abdomen is soft.  Musculoskeletal:        General: No tenderness. Normal range of motion.     Cervical back: Normal range of motion and neck supple.  Skin:    General: Skin is warm and dry.     Coloration: Skin is not pale.     Findings: No erythema or rash.  Neurological:     General: No focal deficit present.     Mental Status: He is alert and oriented to person, place, and time.  Psychiatric:        Mood and Affect:  Mood normal.        Behavior: Behavior normal.        Thought Content: Thought content normal.        Judgment: Judgment normal.           Assessment & Plan:    HIV disease:  I will add order HIV viral load CD4 count CBC with differential CMP, RPR GC and chlamydia and I will continue  Fady K Maceachern's Biktarvy, prescription   STI screening: as above will screen and also recheck RPR  Doxycycline to be taken 2 tablets after sex as doxycycline postexposure prophylaxis  Vaccine counseling: Recommended updated flu vaccine and COVID-19 vaccination but he did not want to do the flu vaccine today which we had for him.

## 2023-07-31 ENCOUNTER — Ambulatory Visit (INDEPENDENT_AMBULATORY_CARE_PROVIDER_SITE_OTHER): Payer: Self-pay | Admitting: Infectious Disease

## 2023-07-31 ENCOUNTER — Other Ambulatory Visit: Payer: Self-pay

## 2023-07-31 ENCOUNTER — Encounter: Payer: Self-pay | Admitting: Infectious Disease

## 2023-07-31 VITALS — BP 123/78 | HR 60 | Temp 98.2°F | Ht 70.0 in | Wt 176.0 lb

## 2023-07-31 DIAGNOSIS — B20 Human immunodeficiency virus [HIV] disease: Secondary | ICD-10-CM

## 2023-07-31 DIAGNOSIS — Z7185 Encounter for immunization safety counseling: Secondary | ICD-10-CM

## 2023-07-31 DIAGNOSIS — Z113 Encounter for screening for infections with a predominantly sexual mode of transmission: Secondary | ICD-10-CM

## 2023-07-31 DIAGNOSIS — A539 Syphilis, unspecified: Secondary | ICD-10-CM

## 2023-07-31 MED ORDER — BIKTARVY 50-200-25 MG PO TABS
1.0000 | ORAL_TABLET | Freq: Every day | ORAL | 11 refills | Status: DC
Start: 2023-07-31 — End: 2024-01-28

## 2023-07-31 MED ORDER — DOXYCYCLINE HYCLATE 100 MG PO TABS: ORAL_TABLET | ORAL | 3 refills | Status: DC

## 2023-08-01 LAB — T-HELPER CELLS (CD4) COUNT (NOT AT ARMC)
CD4 % Helper T Cell: 40 % (ref 33–65)
CD4 T Cell Abs: 789 /uL (ref 400–1790)

## 2023-08-01 LAB — URINE CYTOLOGY ANCILLARY ONLY
Chlamydia: NEGATIVE
Comment: NEGATIVE
Comment: NORMAL
Neisseria Gonorrhea: NEGATIVE

## 2023-08-01 LAB — CYTOLOGY, (ORAL, ANAL, URETHRAL) ANCILLARY ONLY
Chlamydia: NEGATIVE
Chlamydia: NEGATIVE
Comment: NEGATIVE
Comment: NEGATIVE
Comment: NORMAL
Comment: NORMAL
Neisseria Gonorrhea: NEGATIVE
Neisseria Gonorrhea: NEGATIVE

## 2023-08-02 LAB — HIV-1 RNA QUANT-NO REFLEX-BLD
HIV 1 RNA Quant: NOT DETECTED {copies}/mL
HIV-1 RNA Quant, Log: NOT DETECTED {Log_copies}/mL

## 2023-08-02 LAB — CBC WITH DIFFERENTIAL/PLATELET
Absolute Monocytes: 357 {cells}/uL (ref 200–950)
Basophils Absolute: 29 {cells}/uL (ref 0–200)
Basophils Relative: 0.7 %
Eosinophils Absolute: 59 {cells}/uL (ref 15–500)
Eosinophils Relative: 1.4 %
HCT: 37.6 % — ABNORMAL LOW (ref 38.5–50.0)
Hemoglobin: 12.1 g/dL — ABNORMAL LOW (ref 13.2–17.1)
Lymphs Abs: 2050 {cells}/uL (ref 850–3900)
MCH: 25.6 pg — ABNORMAL LOW (ref 27.0–33.0)
MCHC: 32.2 g/dL (ref 32.0–36.0)
MCV: 79.7 fL — ABNORMAL LOW (ref 80.0–100.0)
MPV: 9.6 fL (ref 7.5–12.5)
Monocytes Relative: 8.5 %
Neutro Abs: 1705 {cells}/uL (ref 1500–7800)
Neutrophils Relative %: 40.6 %
Platelets: 323 10*3/uL (ref 140–400)
RBC: 4.72 10*6/uL (ref 4.20–5.80)
RDW: 13.2 % (ref 11.0–15.0)
Total Lymphocyte: 48.8 %
WBC: 4.2 10*3/uL (ref 3.8–10.8)

## 2023-08-02 LAB — RPR TITER: RPR Titer: 1:1 {titer} — ABNORMAL HIGH

## 2023-08-02 LAB — RPR: RPR Ser Ql: REACTIVE — AB

## 2023-08-02 LAB — COMPLETE METABOLIC PANEL WITH GFR
AG Ratio: 1.6 (calc) (ref 1.0–2.5)
ALT: 11 U/L (ref 9–46)
AST: 19 U/L (ref 10–40)
Albumin: 4.6 g/dL (ref 3.6–5.1)
Alkaline phosphatase (APISO): 48 U/L (ref 36–130)
BUN: 10 mg/dL (ref 7–25)
CO2: 26 mmol/L (ref 20–32)
Calcium: 9.5 mg/dL (ref 8.6–10.3)
Chloride: 103 mmol/L (ref 98–110)
Creat: 1 mg/dL (ref 0.60–1.24)
Globulin: 2.8 g/dL (ref 1.9–3.7)
Glucose, Bld: 91 mg/dL (ref 65–99)
Potassium: 4.1 mmol/L (ref 3.5–5.3)
Sodium: 137 mmol/L (ref 135–146)
Total Bilirubin: 0.9 mg/dL (ref 0.2–1.2)
Total Protein: 7.4 g/dL (ref 6.1–8.1)
eGFR: 108 mL/min/{1.73_m2} (ref >=60)

## 2023-08-02 LAB — LIPID PANEL
Cholesterol: 140 mg/dL (ref <200)
HDL: 56 mg/dL (ref >=40)
LDL Cholesterol (Calc): 71 mg/dL
Non-HDL Cholesterol (Calc): 84 mg/dL (ref <130)
Total CHOL/HDL Ratio: 2.5 (calc) (ref <5.0)
Triglycerides: 52 mg/dL (ref <150)

## 2023-08-02 LAB — T PALLIDUM AB: T Pallidum Abs: POSITIVE — AB

## 2023-08-28 ENCOUNTER — Ambulatory Visit: Payer: Self-pay

## 2024-01-28 ENCOUNTER — Other Ambulatory Visit: Payer: Self-pay

## 2024-01-28 ENCOUNTER — Other Ambulatory Visit (HOSPITAL_COMMUNITY)
Admission: RE | Admit: 2024-01-28 | Discharge: 2024-01-28 | Disposition: A | Discharge status: Home / Self Care | Source: Ambulatory Visit | Attending: Infectious Disease | Admitting: Infectious Disease

## 2024-01-28 ENCOUNTER — Ambulatory Visit (INDEPENDENT_AMBULATORY_CARE_PROVIDER_SITE_OTHER): Payer: Self-pay | Admitting: Infectious Disease

## 2024-01-28 ENCOUNTER — Encounter: Payer: Self-pay | Admitting: Infectious Disease

## 2024-01-28 VITALS — BP 115/77 | HR 66 | Temp 98.0°F | Ht 71.0 in | Wt 174.0 lb

## 2024-01-28 DIAGNOSIS — Z113 Encounter for screening for infections with a predominantly sexual mode of transmission: Secondary | ICD-10-CM | POA: Diagnosis not present

## 2024-01-28 DIAGNOSIS — B20 Human immunodeficiency virus [HIV] disease: Secondary | ICD-10-CM

## 2024-01-28 MED ORDER — DOXYCYCLINE HYCLATE 100 MG PO TABS: ORAL_TABLET | ORAL | 5 refills | Status: DC

## 2024-01-28 MED ORDER — BIKTARVY 50-200-25 MG PO TABS
1.0000 | ORAL_TABLET | Freq: Every day | ORAL | 11 refills | Status: DC
Start: 2024-01-28 — End: 2024-08-05

## 2024-01-28 NOTE — Progress Notes (Signed)
 Subjective:  Chief complaint: follow-up for HIV disease on medications   Patient ID: Ethan Mitchell, male    DOB: 06/17/2001, 23 y.o.   MRN: [id number]  HPI  Discussed the use of AI scribe software for clinical note transcription with the patient, who gave verbal consent to proceed.  History of Present Illness   The patient, with a history of HIV, presents for a routine follow-up. He has been compliant with his antiretroviral therapy, Biktarvy, and reports doing well on this regimen. The patient's viral load has been consistently undetectable, with values less than fifty, and his CD4 count remains healthy. He was diagnosed with HIV during a regular check-up and has been managing his condition since then. The patient is also sexually active and takes two tablets of doxycycline after sex as a preventive measure against sexually transmitted infections. He has not received the flu or COVID-19 vaccines but has managed to avoid respiratory illnesses prevalent at his workplace. The patient is currently insured through [employer] via his employer.      Past Medical History:  Diagnosis Date   HIV disease ([facility]) 04/11/2021   Syphilis 08/15/2022   Vaccine counseling 08/15/2022    No past surgical history on file.  No family history on file.    Social History   Socioeconomic History   Marital status: Single    Spouse name: Not on file   Number of children: Not on file   Years of education: Not on file   Highest education level: Not on file  Occupational History   Not on file  Tobacco Use   Smoking status: Former    Types: E-cigarettes, Cigars   Smokeless tobacco: Never   Tobacco comments:    social smoker, sometimes vapes;    States cigars  Substance and Sexual Activity   Alcohol use: Yes    Comment: socially    Drug use: Yes    Types: Marijuana    Comment: daily    Sexual activity: Not Currently    Partners: Male    Comment: declined condoms  Other Topics Concern    Not on file  Social History Narrative   Not on file   Social Drivers of Health   Financial Resource Strain: Not on file  Food Insecurity: Not on file  Transportation Needs: Not on file  Physical Activity: Not on file  Stress: Not on file  Social Connections: Not on file    No Known Allergies   Current Outpatient Medications:    bictegravir-emtricitabine-tenofovir AF (BIKTARVY) 50-200-25 MG TABS tablet, Take 1 tablet by mouth daily., Disp: 30 tablet, Rfl: 11   doxycycline (VIBRA-TABS) 100 MG tablet, 2 tablets after sex to prevent STI, Disp: 60 tablet, Rfl: 5   Review of Systems  Constitutional:  Negative for activity change, appetite change, chills, diaphoresis, fatigue, fever and unexpected weight change.  HENT:  Negative for congestion, rhinorrhea, sinus pressure, sneezing, sore throat and trouble swallowing.   Eyes:  Negative for photophobia and visual disturbance.  Respiratory:  Negative for cough, chest tightness, shortness of breath, wheezing and stridor.   Cardiovascular:  Negative for chest pain, palpitations and leg swelling.  Gastrointestinal:  Negative for abdominal distention, abdominal pain, anal bleeding, blood in stool, constipation, diarrhea, nausea and vomiting.  Genitourinary:  Negative for difficulty urinating, dysuria, flank pain and hematuria.  Musculoskeletal:  Negative for arthralgias, back pain, gait problem, joint swelling and myalgias.  Skin:  Negative for color change, pallor, rash and wound.  Neurological:  Negative for dizziness, tremors, weakness and light-headedness.  Hematological:  Negative for adenopathy. Does not bruise/bleed easily.  Psychiatric/Behavioral:  Negative for agitation, behavioral problems, confusion, decreased concentration, dysphoric mood and sleep disturbance.        Objective:   Physical Exam Constitutional:      Appearance: He is well-developed.  HENT:     Head: Normocephalic and atraumatic.  Eyes:      Conjunctiva/sclera: Conjunctivae normal.  Cardiovascular:     Rate and Rhythm: Normal rate and regular rhythm.  Pulmonary:     Effort: Pulmonary effort is normal. No respiratory distress.     Breath sounds: No wheezing.  Abdominal:     General: There is no distension.     Palpations: Abdomen is soft.  Musculoskeletal:        General: No tenderness. Normal range of motion.     Cervical back: Normal range of motion and neck supple.  Skin:    General: Skin is warm and dry.     Coloration: Skin is not pale.     Findings: No erythema or rash.  Neurological:     General: No focal deficit present.     Mental Status: He is alert and oriented to person, place, and time.  Psychiatric:        Mood and Affect: Mood normal.        Behavior: Behavior normal.        Thought Content: Thought content normal.        Judgment: Judgment normal.           Assessment & Plan:   Assessment and Plan    HIV Viral load undetectable and CD4 count healthy on Biktarvy. Patient adherent to medication and regular check-ups. -Continue Biktarvy. -Check labs today to ensure continued viral suppression and healthy CD4 count.  STI Prevention Patient sexually active, using  doxycycline post sex for STI prevention. -Continue doxycycline post-coital. -Perform STI testing today.  Vaccinations Patient declined flu and COVID vaccines. -No change in plan as patient declined vaccines.  Insurance Patient transitioned from PCAP/ICAP to [employer] through work. -Meet with financial person to confirm insurance status.  Follow-up Patient doing well on current regimen. -Schedule follow-up in six months.

## 2024-01-29 LAB — CYTOLOGY, (ORAL, ANAL, URETHRAL) ANCILLARY ONLY
Chlamydia: NEGATIVE
Chlamydia: POSITIVE — AB
Comment: NEGATIVE
Comment: NEGATIVE
Comment: NORMAL
Comment: NORMAL
Neisseria Gonorrhea: NEGATIVE
Neisseria Gonorrhea: POSITIVE — AB

## 2024-01-29 LAB — URINE CYTOLOGY ANCILLARY ONLY
Chlamydia: NEGATIVE
Comment: NEGATIVE
Comment: NORMAL
Neisseria Gonorrhea: NEGATIVE

## 2024-01-29 LAB — T-HELPER CELLS (CD4) COUNT (NOT AT ARMC)
CD4 % Helper T Cell: 44 % (ref 33–65)
CD4 T Cell Abs: 854 /uL (ref 400–1790)

## 2024-01-30 ENCOUNTER — Other Ambulatory Visit: Payer: Self-pay

## 2024-01-30 ENCOUNTER — Telehealth: Payer: Self-pay

## 2024-01-30 ENCOUNTER — Ambulatory Visit (INDEPENDENT_AMBULATORY_CARE_PROVIDER_SITE_OTHER)

## 2024-01-30 DIAGNOSIS — A549 Gonococcal infection, unspecified: Secondary | ICD-10-CM

## 2024-01-30 DIAGNOSIS — A749 Chlamydial infection, unspecified: Secondary | ICD-10-CM

## 2024-01-30 MED ORDER — CEFTRIAXONE SODIUM 500 MG IJ SOLR
500.0000 mg | Freq: Once | INTRAMUSCULAR | Status: AC
  Administered 2024-01-30: 500 mg via INTRAMUSCULAR

## 2024-01-30 MED ORDER — DOXYCYCLINE HYCLATE 100 MG PO TABS: 100.0000 mg | ORAL_TABLET | Freq: Two times a day (BID) | ORAL | 0 refills | Status: AC

## 2024-01-30 NOTE — Telephone Encounter (Signed)
-----   Message from [place] sent at 01/29/2024  5:43 PM EST ----- Patient has gonorrhea and media he needs 500 mg of intramuscular ceftriaxone given as well as doxycycline 100 mg twice daily for 21 days given it was found in the rectum ----- Message ----- From: Janace Hoard Lab Results In Sent: 01/28/2024  11:11 PM EST To: Randall Hiss, MD

## 2024-01-30 NOTE — Telephone Encounter (Signed)
 Spoke with [facility], relayed results for gonorrhea and chlamydia. He will come in this afternoon for ceftriaxone. Requests doxycycline be sent to [facility] on [address]. Verified no allergies.   Advised patient no sex until treatment is completed plus an additional 7 days and instructed to notify sexual partners for testing and treatment. Patient verbalized understanding and has no further questions.   Sandie Ano, RN

## 2024-01-31 LAB — COMPLETE METABOLIC PANEL WITH GFR
AG Ratio: 1.6 (calc) (ref 1.0–2.5)
ALT: 21 U/L (ref 9–46)
AST: 27 U/L (ref 10–40)
Albumin: 4.6 g/dL (ref 3.6–5.1)
Alkaline phosphatase (APISO): 44 U/L (ref 36–130)
BUN: 10 mg/dL (ref 7–25)
CO2: 29 mmol/L (ref 20–32)
Calcium: 9.5 mg/dL (ref 8.6–10.3)
Chloride: 104 mmol/L (ref 98–110)
Creat: 1 mg/dL (ref 0.60–1.24)
Globulin: 2.8 g/dL (ref 1.9–3.7)
Glucose, Bld: 81 mg/dL (ref 65–99)
Potassium: 4.3 mmol/L (ref 3.5–5.3)
Sodium: 138 mmol/L (ref 135–146)
Total Bilirubin: 0.4 mg/dL (ref 0.2–1.2)
Total Protein: 7.4 g/dL (ref 6.1–8.1)
eGFR: 108 mL/min/{1.73_m2} (ref >=60)

## 2024-01-31 LAB — HIV-1 RNA QUANT-NO REFLEX-BLD
HIV 1 RNA Quant: 28 {copies}/mL — ABNORMAL HIGH
HIV-1 RNA Quant, Log: 1.44 {Log_copies}/mL — ABNORMAL HIGH

## 2024-01-31 LAB — CBC WITH DIFFERENTIAL/PLATELET
Absolute Lymphocytes: 2095 {cells}/uL (ref 850–3900)
Absolute Monocytes: 475 {cells}/uL (ref 200–950)
Basophils Absolute: 32 {cells}/uL (ref 0–200)
Basophils Relative: 0.6 %
Eosinophils Absolute: 81 {cells}/uL (ref 15–500)
Eosinophils Relative: 1.5 %
HCT: 39 % (ref 38.5–50.0)
Hemoglobin: 12 g/dL — ABNORMAL LOW (ref 13.2–17.1)
MCH: 25 pg — ABNORMAL LOW (ref 27.0–33.0)
MCHC: 30.8 g/dL — ABNORMAL LOW (ref 32.0–36.0)
MCV: 81.3 fL (ref 80.0–100.0)
MPV: 9.1 fL (ref 7.5–12.5)
Monocytes Relative: 8.8 %
Neutro Abs: 2716 {cells}/uL (ref 1500–7800)
Neutrophils Relative %: 50.3 %
Platelets: 352 10*3/uL (ref 140–400)
RBC: 4.8 10*6/uL (ref 4.20–5.80)
RDW: 13.3 % (ref 11.0–15.0)
Total Lymphocyte: 38.8 %
WBC: 5.4 10*3/uL (ref 3.8–10.8)

## 2024-01-31 LAB — RPR: RPR Ser Ql: NONREACTIVE

## 2024-02-13 NOTE — Telephone Encounter (Signed)
 Ethan Mitchell has not read MyChart message. Called him to confirm that he was able to get the doxycycline as it's not showing in pharmacy dispense history.   Ethan Mitchell says he did not think he needed the doxy. Discussed that the injection he received was to treat gonorrhea, but that he also needed 21 days of doxycycline to treat chlamydia.   He reports he did get the doxy and is still taking it.   Sandie Ano, RN

## 2024-02-13 NOTE — Progress Notes (Signed)
 [address] - AMBULATORY [facility] [address] [place] [address]   Date of Service: 02/13/2024 Patient DOB: 08/09/2001    History of Present Illness   Patient ID: Ethan Mitchell is a 23 y.o. male. Patient with PMH of HIV presents to urgent care due to dog bite of left forearm that occurred approximately 30 to 60 minutes ago.  Patient states the dog is owned by his mother and believes that vaccines are up-to-date.  Reports bleeding controlled at this time.  Denies numbness, tingling, weakness.  History reviewed. No pertinent past medical history.  BP 126/76 (BP Location: Right arm, Patient Position: Sitting)   Pulse 61   Ht 1.803 m (5' 11)   Wt 76.2 kg (168 lb)   BMI 23.43 kg/m    Review of Systems   Review of Systems  Skin:  Positive for wound (dog bite of left forearm).     Physicial Exam   Physical Exam Vitals reviewed.  Constitutional:      Appearance: Normal appearance. He is normal weight.  Skin:    Comments: 2 puncture wound of left posterior forearm. Bleeding controlled.   Neurological:     Mental Status: He is alert.     Eye/Skin Irrigation  Date/Time: 02/13/2024 4:05 PM  Performed by: Darryle Slater Fish, PA-C Authorized by: Darryle Slater Fish, PA-C  Local anesthesia used: no  Anesthesia: Local anesthesia used: no  Sedation: Patient sedated: no  Patient tolerance: patient tolerated the procedure well with no immediate complications Comments: Pressure irrigation of 300 mL of sterile saline and chlorhexidine applied to puncture wounds. Affected area was dressed with gauze and coban.     Diagnosis   [name] was seen today for animal bite.  Diagnoses and all orders for this visit:  Animal bite -     amoxicillin-pot clavulanate (AUGMENTIN) 875-125 mg per tablet; Take 1 tablet by mouth 2 (two) times a day for 5 days.  Other orders -     Eye/Skin Irrigation      Medical Decision Making Ethan Mitchell is a 23 y.o. male who presents to urgent care today with complaints of  Chief Complaint  Patient presents with  . Animal Bite    Patient reports being bit by his parent's dog on the left forearm. Patient states he believes the dog is up to date on vaccines.     On exam, VS stable and patient is afebrile. Appears well-hydrated and capillary refill <2 seconds.  Ddx: animal bite  Patient presents to urgent care due to dog bite of left forearm. Tdap 2022. Wound was copiously irrigated. Rx Augmentin. Wound care and return precautions discussed.  Discharge Information  Symptomatic management discussed.  Patient was given verbal and written instructions on symptoms that necessitate return to the UC/ED, and instructed to f/u w/ UC or PCP if not improving in expected timeframe.   Patient/parent has been instructed on RX/OTC medications, dosages, side effects, and possible interactions as associated with each diagnosis in my impression and plan above.   Patient education (verbal/handout) given on diagnosis, pathophysiology, treatment of diagnosis, side effects of medication use for treatment, restrictions while taking medication, supportives measures such as staying hydrated.   Red Flags associated with diagnosis/es were reviewed and patient instructed on action plan if red flags develop.   They have been instructed that if symptoms worsen or red flags develop they should return to Urgent Care, go to the  nearest ED, or activate EMS/911.     Patient and/or parent/guardian (if applicable) agreed with plan and voiced understanding.  No barriers to adherence perceived by myself.   Portions of this note may have been dictated using Dragon dictation software/hardware and may contain grammatical or spelling errors.   Electronically signed by @ELECTRON[employer]SIG @ at 4:49 PM.  If a new prescription was given today, then I discussed potential side effects, drug  interactions, instructions for taking the medication, and the consequences of not taking it.    F/u: Follow up closely with primary care provider (PCP) and other specialists for further care and routine care, but seek medical attention sooner if worsening/concerning signs or symptoms.  Home Care   Electronically signed by: Darryle Slater Fish, PA-C 02/13/2024 4:49 PM

## 2024-08-04 ENCOUNTER — Encounter: Payer: Self-pay | Admitting: Infectious Disease

## 2024-08-04 DIAGNOSIS — Z113 Encounter for screening for infections with a predominantly sexual mode of transmission: Secondary | ICD-10-CM | POA: Insufficient documentation

## 2024-08-04 NOTE — Progress Notes (Unsigned)
   Subjective:   Chief complaint: follow-up for HIV disease on medications   Patient ID: Ethan Mitchell, male    DOB: 2001-03-01, 23 y.o.   MRN: [id number]  HPI  Past Medical History:  Diagnosis Date   HIV disease ([facility]) 04/11/2021   Routine screening for STI (sexually transmitted infection) 08/04/2024   Syphilis 08/15/2022   Vaccine counseling 08/15/2022    No past surgical history on file.  No family history on file.    Social History   Socioeconomic History   Marital status: Single    Spouse name: Not on file   Number of children: Not on file   Years of education: Not on file   Highest education level: Not on file  Occupational History   Not on file  Tobacco Use   Smoking status: Former    Types: E-cigarettes, Cigars   Smokeless tobacco: Never   Tobacco comments:    social smoker, sometimes vapes;    States cigars  Substance and Sexual Activity   Alcohol use: Yes    Comment: socially    Drug use: Yes    Types: Marijuana    Comment: daily    Sexual activity: Not Currently    Partners: Male    Comment: declined condoms  Other Topics Concern   Not on file  Social History Narrative   Not on file   Social Drivers of Health   Financial Resource Strain: Not on file  Food Insecurity: Not on file  Transportation Needs: Not on file  Physical Activity: Not on file  Stress: Not on file  Social Connections: Not on file    No Known Allergies   Current Outpatient Medications:    bictegravir-emtricitabine-tenofovir AF (BIKTARVY ) 50-200-25 MG TABS tablet, Take 1 tablet by mouth daily., Disp: 30 tablet, Rfl: 11   doxycycline  (VIBRA -TABS) 100 MG tablet, 2 tablets after sex to prevent STI, Disp: 60 tablet, Rfl: 5    Review of Systems     Objective:   Physical Exam        Assessment & Plan:

## 2024-08-05 ENCOUNTER — Other Ambulatory Visit (HOSPITAL_COMMUNITY)
Admission: RE | Admit: 2024-08-05 | Discharge: 2024-08-05 | Disposition: A | Discharge status: Home / Self Care | Source: Ambulatory Visit | Attending: Infectious Disease | Admitting: Infectious Disease

## 2024-08-05 ENCOUNTER — Other Ambulatory Visit: Payer: Self-pay

## 2024-08-05 ENCOUNTER — Ambulatory Visit (INDEPENDENT_AMBULATORY_CARE_PROVIDER_SITE_OTHER): Admitting: Infectious Disease

## 2024-08-05 ENCOUNTER — Encounter: Payer: Self-pay | Admitting: Infectious Disease

## 2024-08-05 VITALS — BP 119/78 | HR 80 | Temp 98.0°F | Wt 163.0 lb

## 2024-08-05 DIAGNOSIS — Z7185 Encounter for immunization safety counseling: Secondary | ICD-10-CM

## 2024-08-05 DIAGNOSIS — A539 Syphilis, unspecified: Secondary | ICD-10-CM | POA: Diagnosis present

## 2024-08-05 DIAGNOSIS — Z113 Encounter for screening for infections with a predominantly sexual mode of transmission: Secondary | ICD-10-CM

## 2024-08-05 DIAGNOSIS — B20 Human immunodeficiency virus [HIV] disease: Secondary | ICD-10-CM | POA: Diagnosis not present

## 2024-08-05 MED ORDER — DOXYCYCLINE HYCLATE 100 MG PO TABS: ORAL_TABLET | ORAL | 5 refills | Status: AC

## 2024-08-05 MED ORDER — BIKTARVY 50-200-25 MG PO TABS: 1.0000 | ORAL_TABLET | Freq: Every day | ORAL | 11 refills | Status: AC

## 2024-08-06 LAB — T-HELPER CELLS (CD4) COUNT (NOT AT ARMC)
CD4 % Helper T Cell: 50 % (ref 33–65)
CD4 T Cell Abs: 743 /uL (ref 400–1790)

## 2024-08-06 LAB — CYTOLOGY, (ORAL, ANAL, URETHRAL) ANCILLARY ONLY
Chlamydia: NEGATIVE
Chlamydia: NEGATIVE
Comment: NEGATIVE
Comment: NEGATIVE
Comment: NORMAL
Comment: NORMAL
Neisseria Gonorrhea: NEGATIVE
Neisseria Gonorrhea: NEGATIVE

## 2024-08-06 LAB — URINE CYTOLOGY ANCILLARY ONLY
Chlamydia: NEGATIVE
Comment: NEGATIVE
Comment: NORMAL
Neisseria Gonorrhea: NEGATIVE

## 2024-08-08 LAB — CBC WITH DIFFERENTIAL/PLATELET
Absolute Lymphocytes: 1625 {cells}/uL (ref 850–3900)
Absolute Monocytes: 396 {cells}/uL (ref 200–950)
Basophils Absolute: 22 {cells}/uL (ref 0–200)
Basophils Relative: 0.5 %
Eosinophils Absolute: 30 {cells}/uL (ref 15–500)
Eosinophils Relative: 0.7 %
HCT: 39.2 % (ref 38.5–50.0)
Hemoglobin: 12.3 g/dL — ABNORMAL LOW (ref 13.2–17.1)
MCH: 25.8 pg — ABNORMAL LOW (ref 27.0–33.0)
MCHC: 31.4 g/dL — ABNORMAL LOW (ref 32.0–36.0)
MCV: 82.4 fL (ref 80.0–100.0)
MPV: 8.8 fL (ref 7.5–12.5)
Monocytes Relative: 9.2 %
Neutro Abs: 2227 {cells}/uL (ref 1500–7800)
Neutrophils Relative %: 51.8 %
Platelets: 355 Thousand/uL (ref 140–400)
RBC: 4.76 Million/uL (ref 4.20–5.80)
RDW: 13.5 % (ref 11.0–15.0)
Total Lymphocyte: 37.8 %
WBC: 4.3 Thousand/uL (ref 3.8–10.8)

## 2024-08-08 LAB — COMPLETE METABOLIC PANEL WITHOUT GFR
AG Ratio: 1.7 (calc) (ref 1.0–2.5)
ALT: 11 U/L (ref 9–46)
AST: 19 U/L (ref 10–40)
Albumin: 4.6 g/dL (ref 3.6–5.1)
Alkaline phosphatase (APISO): 45 U/L (ref 36–130)
BUN: 10 mg/dL (ref 7–25)
CO2: 27 mmol/L (ref 20–32)
Calcium: 9.5 mg/dL (ref 8.6–10.3)
Chloride: 103 mmol/L (ref 98–110)
Creat: 1.11 mg/dL (ref 0.60–1.24)
Globulin: 2.7 g/dL (ref 1.9–3.7)
Glucose, Bld: 80 mg/dL (ref 65–99)
Potassium: 3.9 mmol/L (ref 3.5–5.3)
Sodium: 137 mmol/L (ref 135–146)
Total Bilirubin: 0.7 mg/dL (ref 0.2–1.2)
Total Protein: 7.3 g/dL (ref 6.1–8.1)

## 2024-08-08 LAB — RPR: RPR Ser Ql: REACTIVE — AB

## 2024-08-08 LAB — T PALLIDUM AB: T Pallidum Abs: POSITIVE — AB

## 2024-08-08 LAB — RPR TITER: RPR Titer: 1:2 {titer} — ABNORMAL HIGH

## 2024-08-08 LAB — HIV-1 RNA QUANT-NO REFLEX-BLD
HIV 1 RNA Quant: NOT DETECTED {copies}/mL
HIV-1 RNA Quant, Log: NOT DETECTED {Log_copies}/mL

## 2024-12-02 ENCOUNTER — Telehealth: Payer: Self-pay

## 2024-12-02 NOTE — Telephone Encounter (Signed)
 Left patient a voice mail after request for an appointment, If patient needs STI Testing ok to scheduled with Pharmacy.

## 2025-01-26 ENCOUNTER — Ambulatory Visit: Admitting: Infectious Disease
